# Patient Record
Sex: Female | Born: 1970 | Race: Black or African American | Hispanic: No | Marital: Single | Smoking: Never smoker
Health system: Southern US, Community
[De-identification: ages and names within clinical notes are randomized; demographics above are authoritative.]

## PROBLEM LIST (undated history)

## (undated) DIAGNOSIS — T4145XA Adverse effect of unspecified anesthetic, initial encounter: Secondary | ICD-10-CM

## (undated) DIAGNOSIS — M1990 Unspecified osteoarthritis, unspecified site: Secondary | ICD-10-CM

## (undated) DIAGNOSIS — T8859XA Other complications of anesthesia, initial encounter: Secondary | ICD-10-CM

## (undated) DIAGNOSIS — I10 Essential (primary) hypertension: Secondary | ICD-10-CM

## (undated) DIAGNOSIS — Z9889 Other specified postprocedural states: Secondary | ICD-10-CM

## (undated) DIAGNOSIS — R112 Nausea with vomiting, unspecified: Secondary | ICD-10-CM

## (undated) DIAGNOSIS — F32A Depression, unspecified: Secondary | ICD-10-CM

## (undated) DIAGNOSIS — F429 Obsessive-compulsive disorder, unspecified: Secondary | ICD-10-CM

## (undated) DIAGNOSIS — F329 Major depressive disorder, single episode, unspecified: Secondary | ICD-10-CM

## (undated) DIAGNOSIS — A159 Respiratory tuberculosis unspecified: Secondary | ICD-10-CM

## (undated) HISTORY — DX: Essential (primary) hypertension: I10

## (undated) HISTORY — DX: Obsessive-compulsive disorder, unspecified: F42.9

## (undated) HISTORY — PX: TOOTH EXTRACTION: SUR596

## (undated) HISTORY — DX: Depression, unspecified: F32.A

## (undated) HISTORY — DX: Major depressive disorder, single episode, unspecified: F32.9

## (undated) HISTORY — PX: COLONOSCOPY: SHX174

## (undated) HISTORY — PX: NO PAST SURGERIES: SHX2092

---

## 2009-01-28 ENCOUNTER — Encounter: Payer: Self-pay | Admitting: Emergency Medicine

## 2009-01-29 ENCOUNTER — Ambulatory Visit: Payer: Self-pay | Admitting: Psychiatry

## 2009-01-29 ENCOUNTER — Inpatient Hospital Stay (HOSPITAL_COMMUNITY): Admission: RE | Admit: 2009-01-29 | Discharge: 2009-02-03 | Payer: Self-pay | Admitting: Psychiatry

## 2010-08-11 LAB — URINALYSIS, ROUTINE W REFLEX MICROSCOPIC
Hgb urine dipstick: NEGATIVE
Nitrite: NEGATIVE
Protein, ur: NEGATIVE mg/dL
Urobilinogen, UA: 1 mg/dL (ref 0.0–1.0)

## 2010-08-11 LAB — CBC
Hemoglobin: 13.5 g/dL (ref 12.0–15.0)
RBC: 4.89 MIL/uL (ref 3.87–5.11)

## 2010-08-11 LAB — RAPID URINE DRUG SCREEN, HOSP PERFORMED
Amphetamines: NOT DETECTED
Barbiturates: NOT DETECTED
Benzodiazepines: NOT DETECTED
Tetrahydrocannabinol: NOT DETECTED

## 2010-08-11 LAB — COMPREHENSIVE METABOLIC PANEL
ALT: 20 U/L (ref 0–35)
Alkaline Phosphatase: 54 U/L (ref 39–117)
CO2: 26 mEq/L (ref 19–32)
Chloride: 102 mEq/L (ref 96–112)
GFR calc non Af Amer: >60 mL/min (ref >60)
Glucose, Bld: 99 mg/dL (ref 70–99)
Potassium: 3.9 mEq/L (ref 3.5–5.1)
Sodium: 135 mEq/L (ref 135–145)
Total Bilirubin: 0.6 mg/dL (ref 0.3–1.2)

## 2010-08-11 LAB — GC/CHLAMYDIA PROBE AMP, URINE: Chlamydia, Swab/Urine, PCR: NEGATIVE

## 2010-08-11 LAB — HIV ANTIBODY (ROUTINE TESTING W REFLEX): HIV: NONREACTIVE

## 2010-08-11 LAB — DIFFERENTIAL
Basophils Relative: 1 % (ref 0–1)
Eosinophils Absolute: 0 10*3/uL (ref 0.0–0.7)
Monocytes Relative: 6 % (ref 3–12)
Neutrophils Relative %: 62 % (ref 43–77)

## 2010-08-11 LAB — URINE MICROSCOPIC-ADD ON

## 2010-08-11 LAB — RPR: RPR Ser Ql: NONREACTIVE

## 2011-05-14 ENCOUNTER — Ambulatory Visit
Admission: RE | Admit: 2011-05-14 | Discharge: 2011-05-14 | Disposition: A | Discharge status: Home / Self Care | Payer: Medicaid Other | Source: Ambulatory Visit | Attending: Specialist | Admitting: Specialist

## 2011-05-14 ENCOUNTER — Other Ambulatory Visit: Payer: Self-pay | Admitting: Specialist

## 2011-05-14 DIAGNOSIS — M549 Dorsalgia, unspecified: Secondary | ICD-10-CM

## 2013-02-12 ENCOUNTER — Encounter (HOSPITAL_COMMUNITY): Payer: Self-pay | Admitting: Emergency Medicine

## 2013-02-12 ENCOUNTER — Emergency Department (HOSPITAL_COMMUNITY)
Admission: EM | Admit: 2013-02-12 | Discharge: 2013-02-12 | Disposition: A | Discharge status: Home / Self Care | Payer: PRIVATE HEALTH INSURANCE | Source: Home / Self Care

## 2013-02-12 DIAGNOSIS — R141 Gas pain: Secondary | ICD-10-CM

## 2013-02-12 DIAGNOSIS — R350 Frequency of micturition: Secondary | ICD-10-CM

## 2013-02-12 DIAGNOSIS — K5900 Constipation, unspecified: Secondary | ICD-10-CM

## 2013-02-12 LAB — POCT PREGNANCY, URINE: Preg Test, Ur: NEGATIVE

## 2013-02-12 LAB — POCT URINALYSIS DIP (DEVICE)
Bilirubin Urine: NEGATIVE
Glucose, UA: NEGATIVE mg/dL
Hgb urine dipstick: NEGATIVE
Ketones, ur: NEGATIVE mg/dL
Leukocytes, UA: NEGATIVE
Nitrite: NEGATIVE
Protein, ur: NEGATIVE mg/dL
pH: 7 (ref 5.0–8.0)

## 2013-02-12 NOTE — ED Provider Notes (Signed)
CSN: [id number]     Arrival date & time 02/12/13  4098 History   First MD Initiated Contact with Patient 02/12/13 1010     Chief Complaint  Patient presents with  . Urinary Tract Infection   (Consider location/radiation/quality/duration/timing/severity/associated sxs/prior Treatment) HPI Comments: 42 year old female complaining of urinary frequency and vaginal odor to the urine. This started 4-5 days ago. She denies dysuria. Her other concern is that her LMP was 9:15 it was a light flow and she did not have the usual uterine-type cramps that she usually does. Denies pelvic pain, vaginal discharge or bleeding.  Patient is a 42 y.o. female presenting with urinary tract infection.  Urinary Tract Infection Pertinent negatives include no abdominal pain.    History reviewed. No pertinent past medical history. History reviewed. No pertinent past surgical history. No family history on file. History  Substance Use Topics  . Smoking status: Never Smoker   . Smokeless tobacco: Not on file  . Alcohol Use: No   OB History   Grav Para Term Preterm Abortions TAB SAB Ect Mult Living                 Review of Systems  Constitutional: Negative.  Negative for fever.  HENT: Negative.   Respiratory: Negative.   Cardiovascular: Positive for palpitations.  Gastrointestinal: Positive for constipation. Negative for abdominal pain, diarrhea and blood in stool.       Bloating. The sensation of "pokes" and movement in the abdomen.  Genitourinary: Positive for frequency. Negative for dysuria, flank pain, vaginal bleeding, vaginal discharge, vaginal pain and pelvic pain.  Skin: Negative for rash.  Neurological: Negative.     Allergies  Review of patient's allergies indicates no known allergies.  Home Medications   Current Outpatient Rx  Name  Route  Sig  Dispense  Refill  . meloxicam (MOBIC) 7.5 MG tablet   Oral   Take 7.5 mg by mouth daily.          BP 147/75  Pulse 54  Temp(Src) 98 F  (36.7 C)  SpO2 100%  LMP 01/19/2013 Physical Exam  Nursing note and vitals reviewed. Constitutional: She is oriented to person, place, and time. She appears well-developed and well-nourished. No distress.  Eyes: Conjunctivae and EOM are normal.  Neck: Normal range of motion.  Cardiovascular: Normal rate.   Pulmonary/Chest: Effort normal and breath sounds normal.  Abdominal: Soft. Bowel sounds are normal. She exhibits no distension. There is no tenderness. There is no rebound and no guarding.  Abdomen pelvis is tympanic in several areas. No tenderness.  Musculoskeletal: She exhibits no edema.  Neurological: She is alert and oriented to person, place, and time. She exhibits normal muscle tone.  Skin: Skin is warm and dry. No rash noted. No erythema.    ED Course  Procedures (including critical care time) Labs Review Labs Reviewed  URINE CULTURE  POCT URINALYSIS DIP (DEVICE)   Imaging Review No results found. Results for orders placed during the hospital encounter of 02/12/13  POCT URINALYSIS DIP (DEVICE)      Result Value Range   Glucose, UA NEGATIVE  NEGATIVE mg/dL   Bilirubin Urine NEGATIVE  NEGATIVE   Ketones, ur NEGATIVE  NEGATIVE mg/dL   Specific Gravity, Urine 1.025  1.005 - 1.030   Hgb urine dipstick NEGATIVE  NEGATIVE   pH 7.0  5.0 - 8.0   Protein, ur NEGATIVE  NEGATIVE mg/dL   Urobilinogen, UA 0.2  0.0 - 1.0 mg/dL   Nitrite NEGATIVE  NEGATIVE   Leukocytes, UA NEGATIVE  NEGATIVE   Pregnancy test reported by [facility] as negative.   MDM   1. Constipation   2. Abdominal gas pain   3. Urinary frequency       For dysuria, pain or burning with urination, urinary urgency or increased frequency or pelvic discomfort recheck promptly. We will culture the urine and for any growth will treat over the phone. Recommend increasing the fiber in her diet as well as fluids. Constipation take MiraLax as needed.  Hayden Rasmussen, NP 02/12/13 1054

## 2013-02-12 NOTE — ED Notes (Signed)
Reports low abdominal soreness or tenderness, reports frequent urination and odor to urine and urine is cloudy.  Denies pain with urination, denies vaginal discharge.  Last period 9/15-different-spotted.  Patient reports a negative home pregnancy test

## 2013-02-13 LAB — URINE CULTURE: Colony Count: 9000

## 2013-02-13 NOTE — ED Provider Notes (Signed)
Medical screening examination/treatment/procedure(s) were performed by a resident physician or non-physician practitioner and as the supervising physician I was immediately available for consultation/collaboration.  Alohilani Levenhagen, MD    Victoriana Aziz S Davielle Lingelbach, MD 02/13/13 0736 

## 2013-10-13 ENCOUNTER — Ambulatory Visit (INDEPENDENT_AMBULATORY_CARE_PROVIDER_SITE_OTHER): Payer: No Typology Code available for payment source | Admitting: Family Medicine

## 2013-10-13 ENCOUNTER — Encounter: Payer: Self-pay | Admitting: Family Medicine

## 2013-10-13 VITALS — BP 143/75 | HR 68 | Temp 98.2°F | Resp 20 | Ht 68.0 in | Wt 257.0 lb

## 2013-10-13 DIAGNOSIS — L659 Nonscarring hair loss, unspecified: Secondary | ICD-10-CM

## 2013-10-13 DIAGNOSIS — R635 Abnormal weight gain: Secondary | ICD-10-CM

## 2013-10-13 DIAGNOSIS — Z1231 Encounter for screening mammogram for malignant neoplasm of breast: Secondary | ICD-10-CM

## 2013-10-13 DIAGNOSIS — F411 Generalized anxiety disorder: Secondary | ICD-10-CM

## 2013-10-13 DIAGNOSIS — M79609 Pain in unspecified limb: Secondary | ICD-10-CM

## 2013-10-13 DIAGNOSIS — Z23 Encounter for immunization: Secondary | ICD-10-CM

## 2013-10-13 DIAGNOSIS — F329 Major depressive disorder, single episode, unspecified: Secondary | ICD-10-CM | POA: Insufficient documentation

## 2013-10-13 DIAGNOSIS — R252 Cramp and spasm: Secondary | ICD-10-CM

## 2013-10-13 DIAGNOSIS — M79651 Pain in right thigh: Secondary | ICD-10-CM

## 2013-10-13 DIAGNOSIS — F3289 Other specified depressive episodes: Secondary | ICD-10-CM

## 2013-10-13 DIAGNOSIS — F32A Depression, unspecified: Secondary | ICD-10-CM | POA: Insufficient documentation

## 2013-10-13 MED ORDER — MELOXICAM 15 MG PO TABS: 15.0000 mg | ORAL_TABLET | Freq: Every day | ORAL | Status: DC

## 2013-10-13 NOTE — Patient Instructions (Signed)
Follow 1800 calorie diet divided over 6 small meals (written information provided) Increase water intake to 6-8 glasses  Start walking regimen 3 days per week for 30 minutes Start Meloxicam 15 mg daily for right thigh pain.

## 2013-10-13 NOTE — Progress Notes (Signed)
Subjective:    Patient ID: Megan [name], female    DOB: June 18, 1970, 43 y.o.   MRN: [id number]  HPI Patient reports that she is in office for transition into care.   Patient complaining of right thigh pain for greater than 2 months. Reports that she was taking Meloxicam 7.5 mg daily and Vicodin. Current pain intensity is 6/10, intermittent, and radiates to lower back. She states that she was diagnosed with degenerative disc disease and arthritis some years ago.   Patient reports weight gain. States that weight has been increasing for the past year. She maintains that she eats 1-2 times daily on most days and she does not have an appetite. Denies headache, dizziness, nausea, vomiting, diarrhea, constipation, or fatigue. She reports hair thinning.      Review of Systems  Constitutional: Positive for appetite change. Negative for fatigue.  HENT: Negative.   Eyes: Negative.   Respiratory: Negative.   Cardiovascular: Positive for palpitations.  Gastrointestinal: Positive for constipation.  Endocrine: Negative.   Genitourinary: Negative.   Allergic/Immunologic: Negative.   Neurological: Negative.   Hematological: Negative.   Psychiatric/Behavioral: Negative.        Objective:   Physical Exam  Constitutional: She is oriented to person, place, and time. She appears well-developed and well-nourished. She does not have a sickly appearance.  HENT:  Head: Normocephalic and atraumatic.  Right Ear: Hearing, tympanic membrane, external ear and ear canal normal.  Left Ear: Hearing, tympanic membrane, external ear and ear canal normal.  Nose: Nose normal.  Mouth/Throat: Uvula is midline.  Eyes: Conjunctivae and EOM are normal. Pupils are equal, round, and reactive to light. Lids are everted and swept, no foreign bodies found.  Neck: Trachea normal and normal range of motion. Neck supple. No mass and no thyromegaly present.  Cardiovascular: Normal rate, regular rhythm and normal heart  sounds.   Pulmonary/Chest: Effort normal and breath sounds normal.  Abdominal: Soft. Normal appearance and bowel sounds are normal. There is no tenderness.  Musculoskeletal:       Right knee: She exhibits decreased range of motion. She exhibits no swelling.       Legs: Lymphadenopathy:       Head (right side): No submental and no submandibular adenopathy present.       Head (left side): No submental and no submandibular adenopathy present.    She has no cervical adenopathy.  Neurological: She is alert and oriented to person, place, and time. She has normal strength. No cranial nerve deficit or sensory deficit.  Skin: Skin is warm, dry and intact. No rash noted. No cyanosis. Nails show no clubbing.     Psychiatric: She has a normal mood and affect. Her speech is normal and behavior is normal. Judgment and thought content normal.    BP 143/75  Pulse 68  Temp(Src) 98.2 F (36.8 C) (Oral)  Resp 20  Ht 5\' 8"  (1.727 m)  Wt 257 lb (116.574 kg)  BMI 39.09 kg/m2      Assessment & Plan:  1. Right thigh pain: Reports right thigh pain 6/10 in intensity, intermittent, and throbbing. She states that pain radiates from low back to right thigh at times. Right thigh pain interferes with activities of daily living. She states that she works at [employer] and stands for long periods of time. Recommend that she adds insert to her work tennis shoes. Elevate lower extremities at rest. Apply warm compress to right thigh for 20 minutes 4 times daily as needed. Recommend that patient  loose weight. Started Meloxicam 15 mg daily.    2. Weight gain: Patient has reports that weight has been fluctuating. Recommend that patient start 1800 calorie lowfat, low-carbohydrate diet divided over 6 small meals. Increase water intake to 6-8 glasses per day. Start walking regimen (low impact), 15 minutes 3 times per week. Increase walking time weekly. Check HbA1C and TSH.   3. Depression- Patient reports that her son goes to  [facility], she feels comfortable with staff and plans on making an appointment . She reports that she was on anti-depressants years ago, but discontinued use due to insurance constraints. Patient scored 9 on depression screen. She denies suicidal or homicidal ideations. She states that she has had  obsessive compulsions in the past. She reports that she repeatedly counts objects and has repetitive motions during periods of anxiety. .   4. 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 Has to get chest x-rays. Will review previous medical records as they become available RTC: 2 months with Dr. Ashley RoyaltyMatthews for CPE with Pap smear Mammogram: Referral for mammogram. Patient has never had a mammogram.  Immunizations: TDap vaccination given without complication. VIS reviewed prior to administering vaccination.  Gynecological: Pap smear, 3 years ago, normal per patient. States that menstrual periods are normal. Patient has 3 sons.   Massie MaroonLachina M Hollis, FNP

## 2013-10-14 ENCOUNTER — Telehealth: Payer: Self-pay

## 2013-10-14 NOTE — Telephone Encounter (Signed)
Pt was contacted and VM was left  informing her of an Appointment to the [facility] [phone] Appointment date is 10/22/2013@ 2:20pm Pt was advised to contact office if she needed more info.

## 2013-10-15 LAB — BASIC METABOLIC PANEL
BUN: 13 mg/dL (ref 6–23)
CHLORIDE: 104 meq/L (ref 96–112)
CO2: 23 meq/L (ref 19–32)
CREATININE: 0.96 mg/dL (ref 0.50–1.10)
Calcium: 9.3 mg/dL (ref 8.4–10.5)
GLUCOSE: 82 mg/dL (ref 70–99)
Potassium: 4.2 mEq/L (ref 3.5–5.3)
Sodium: 139 mEq/L (ref 135–145)

## 2013-10-15 LAB — TSH: TSH: 1.454 u[IU]/mL (ref 0.350–4.500)

## 2013-10-15 LAB — HEMOGLOBIN A1C
Hgb A1c MFr Bld: 5.6 % (ref <5.7)
Mean Plasma Glucose: 114 mg/dL (ref <117)

## 2013-10-16 DIAGNOSIS — L659 Nonscarring hair loss, unspecified: Secondary | ICD-10-CM | POA: Insufficient documentation

## 2013-10-22 ENCOUNTER — Ambulatory Visit: Payer: No Typology Code available for payment source

## 2013-10-28 ENCOUNTER — Ambulatory Visit
Admission: RE | Admit: 2013-10-28 | Discharge: 2013-10-28 | Disposition: A | Discharge status: Home / Self Care | Payer: No Typology Code available for payment source | Source: Ambulatory Visit | Attending: Family Medicine | Admitting: Family Medicine

## 2013-10-28 DIAGNOSIS — Z1231 Encounter for screening mammogram for malignant neoplasm of breast: Secondary | ICD-10-CM

## 2013-12-17 ENCOUNTER — Ambulatory Visit (INDEPENDENT_AMBULATORY_CARE_PROVIDER_SITE_OTHER): Payer: No Typology Code available for payment source | Admitting: Internal Medicine

## 2013-12-17 VITALS — BP 147/90 | HR 56 | Temp 98.2°F | Resp 14 | Ht 68.0 in | Wt 257.0 lb

## 2013-12-17 DIAGNOSIS — M5441 Lumbago with sciatica, right side: Secondary | ICD-10-CM

## 2013-12-17 DIAGNOSIS — Z0000 Encounter for general adult medical examination without abnormal findings: Secondary | ICD-10-CM

## 2013-12-17 DIAGNOSIS — N898 Other specified noninflammatory disorders of vagina: Secondary | ICD-10-CM

## 2013-12-17 DIAGNOSIS — M25559 Pain in unspecified hip: Secondary | ICD-10-CM

## 2013-12-17 DIAGNOSIS — M25551 Pain in right hip: Secondary | ICD-10-CM

## 2013-12-17 DIAGNOSIS — M5430 Sciatica, unspecified side: Secondary | ICD-10-CM

## 2013-12-17 NOTE — Progress Notes (Signed)
Patient ID: Megan [name], female   DOB: 1970-08-20, 43 y.o.   MRN: [id number]   Megan [name], is a 43 y.o. female  WUJ:[id number]  [id number]  DOB - 1970-08-20  CC:  Chief Complaint  Patient presents with  . Annual Exam       HPI: Megan [name] is a 43 y.o. female here today for Annual Physical Examination.  She is sexually active with one partner. She has had a vaginal discharge without any odor. She reports that she has mid-ovulatory pain on a regular basis. He last mammogram was performed about 1 month ago and was normal. She has gained weight since her last Physical 2 years ago and is concerned that she is unable to maintain a healthy weight especially with the pain that she has been having in her back and right hip.    She has pain in the right low back area with radiation of pain down the right thigh and "sweeping around tot he front". Pt also c/o pain in right hip and states that  she feels as if her bones are grinding together when she walks. This pain limits her walking and is impacting her ability to perform her job.  No headache, No chest pain, No abdominal pain - No Nausea, No new weakness tingling or numbness, No Cough - SOB.  No Known Allergies No past medical history on file. Current Outpatient Prescriptions on File Prior to Visit  Medication Sig Dispense Refill  . Naproxen Sodium (ALEVE PO) Take by mouth.       No current facility-administered medications on file prior to visit.   No family history on file. History   Social History  . Marital Status: Single    Spouse Name: N/A    Number of Children: N/A  . Years of Education: N/A   Occupational History  . Not on file.   Social History Main Topics  . Smoking status: Never Smoker   . Smokeless tobacco: Not on file  . Alcohol Use: No  . Drug Use: No  . Sexual Activity: Not on file   Other Topics Concern  . Not on file   Social History Narrative  . No narrative on file    Review of  Systems: Constitutional: Negative for fever, chills, diaphoresis, activity change, appetite change and fatigue. HENT: Negative for ear pain, nosebleeds, congestion, facial swelling, rhinorrhea, neck pain, neck stiffness and ear discharge.  Eyes: Negative for pain, discharge, redness, itching and visual disturbance. Respiratory: Negative for cough, choking, chest tightness, shortness of breath, wheezing and stridor.  Cardiovascular: Negative for chest pain, palpitations and leg swelling. Gastrointestinal: Negative for abdominal distention. Genitourinary: Negative for dysuria, urgency, frequency, hematuria, flank pain, decreased urine volume, difficulty urinating and dyspareunia.  Musculoskeletal: Negative for back pain, joint swelling, arthralgia and gait problem. Neurological: Negative for dizziness, tremors, seizures, syncope, facial asymmetry, speech difficulty, weakness, light-headedness, numbness and headaches.  Hematological: Negative for adenopathy. Does not bruise/bleed easily. Psychiatric/Behavioral: Negative for hallucinations, behavioral problems, confusion, dysphoric mood, decreased concentration and agitation.    Objective:         Filed Vitals:   12/17/13 0902  BP: 147/90  Pulse: 56  Temp: 98.2 F (36.8 C)  Resp: 14    Physical Exam: Constitutional: Patient appears well-developed and well-nourished. No distress. HENT: Normocephalic, atraumatic, External right and left ear normal. Oropharynx is clear and moist.  Eyes: Conjunctivae and EOM are normal. PERRLA, no scleral icterus. Neck: Normal ROM. Neck supple. No JVD. No tracheal deviation.  No thyromegaly. CVS: RRR, S1/S2 +, no murmurs, no gallops, no carotid bruit.  Pulmonary: Effort and breath sounds normal, no stridor, rhonchi, wheezes, rales.  Abdominal: Soft. BS +, no distension, tenderness, rebound or guarding.  Musculoskeletal: Normal range of motion. Tenderness elicited on palpation of the right Sacro-iliac  joint. Pt has pain with passive movement of the right hip and AROM is limited by pain. She does have a small leg length discrepancy Lymphadenopathy: No lymphadenopathy noted, cervical, inguinal or axillary Neuro: Alert. Normal reflexes, muscle tone coordination. No cranial nerve deficit. Skin: Skin is warm and dry. No rash noted. Not diaphoretic. No erythema. No pallor. Psychiatric: Normal mood and affect. Behavior, judgment, thought content normal. Genitalia: External genitalia normal. Vaginal vault normal with normal physiological discharge. No friability and no bleeding. Bi-manual examination reveals no tenderness in supra-pubic region and adnexal examination normal.  Lab Results  Component Value Date   WBC 3.7* 01/28/2009   HGB 13.5 01/28/2009   HCT 40.7 01/28/2009   MCV 83.2 01/28/2009   PLT 237 01/28/2009   Lab Results  Component Value Date   CREATININE 0.96 10/13/2013   BUN 13 10/13/2013   NA 139 10/13/2013   K 4.2 10/13/2013   CL 104 10/13/2013   CO2 23 10/13/2013    Lab Results  Component Value Date   HGBA1C 5.6 10/13/2013   Lipid Panel  No results found for this basename: chol, trig, hdl, cholhdl, vldl, ldlcalc       Assessment and plan:   1. Annual physical exam - Lipid panel - COMPLETE METABOLIC PANEL WITH GFR - PAP, Thin Prep w/HPV rflx HPV Type 16/18 ([facility]) - HIV antibody (with reflex)  2. Right Sided low back pain with right-sided sciatica - Advised patient to perform stretching exercises with ROM limited by pain.  3. Right hip pain - Suspect osteoarthritis. Will obtain X-ray of hip. Discontinue meloxacam and continue aleve for now.  - DG Hip Complete Right; Future  4. Vaginal discharge - Likely Physiological - GC/Chlamydia Amp Probe, Genital - HIV antibody (with reflex)   Return in about 1 month (around 01/17/2014) for Blood Pressure.  The patient was given clear instructions to go to ER or return to medical center if symptoms don't improve, worsen or new  problems develop. The patient verbalized understanding. The patient was told to call to get lab results if they haven't heard anything in the next week.     This note has been created with Education officer, environmental. Any transcriptional errors are unintentional.    MATTHEWS,MICHELLE A., MD [facility] [place], [place] [phone]   12/17/2013, 10:29 AM

## 2013-12-18 LAB — COMPLETE METABOLIC PANEL WITH GFR
ALT: 11 U/L (ref 0–35)
AST: 16 U/L (ref 0–37)
Albumin: 4.3 g/dL (ref 3.5–5.2)
Alkaline Phosphatase: 49 U/L (ref 39–117)
BUN: 9 mg/dL (ref 6–23)
CALCIUM: 8.8 mg/dL (ref 8.4–10.5)
CHLORIDE: 103 meq/L (ref 96–112)
CO2: 27 mEq/L (ref 19–32)
Creat: 0.78 mg/dL (ref 0.50–1.10)
GFR, Est African American: >89 mL/min
GFR, Est Non African American: >89 mL/min
Glucose, Bld: 100 mg/dL — ABNORMAL HIGH (ref 70–99)
Potassium: 4.1 mEq/L (ref 3.5–5.3)
SODIUM: 138 meq/L (ref 135–145)
TOTAL PROTEIN: 7.2 g/dL (ref 6.0–8.3)
Total Bilirubin: 0.4 mg/dL (ref 0.2–1.2)

## 2013-12-18 LAB — LIPID PANEL
Cholesterol: 181 mg/dL (ref 0–200)
HDL: 43 mg/dL (ref >39)
LDL CALC: 126 mg/dL — AB (ref 0–99)
Total CHOL/HDL Ratio: 4.2 Ratio
Triglycerides: 60 mg/dL (ref <150)
VLDL: 12 mg/dL (ref 0–40)

## 2013-12-18 LAB — PAP, THIN PREP W/HPV RFLX HPV TYPE 16/18: HPV DNA High Risk: NOT DETECTED

## 2013-12-18 LAB — HIV ANTIBODY (ROUTINE TESTING W REFLEX): HIV: NONREACTIVE

## 2013-12-20 ENCOUNTER — Other Ambulatory Visit: Payer: Self-pay | Admitting: Internal Medicine

## 2013-12-25 LAB — GC/CHLAMYDIA PROBE AMP, GENITAL
CHLAMYDIA, DNA PROBE: NOT DETECTED
GC Probe Amp, Genital: NOT DETECTED

## 2014-01-07 ENCOUNTER — Telehealth: Payer: Self-pay | Admitting: Internal Medicine

## 2014-01-07 NOTE — Telephone Encounter (Signed)
Left voicemail for patient to call to reschedule appointment for 01/20/14

## 2014-01-20 ENCOUNTER — Ambulatory Visit: Payer: No Typology Code available for payment source | Admitting: Internal Medicine

## 2014-01-21 ENCOUNTER — Ambulatory Visit: Payer: No Typology Code available for payment source | Admitting: Internal Medicine

## 2014-01-25 ENCOUNTER — Ambulatory Visit (INDEPENDENT_AMBULATORY_CARE_PROVIDER_SITE_OTHER): Payer: No Typology Code available for payment source | Admitting: Internal Medicine

## 2014-01-25 VITALS — BP 145/79 | HR 69 | Temp 98.3°F | Resp 16 | Ht 68.0 in | Wt 256.0 lb

## 2014-01-25 DIAGNOSIS — M5430 Sciatica, unspecified side: Secondary | ICD-10-CM | POA: Insufficient documentation

## 2014-01-25 DIAGNOSIS — I10 Essential (primary) hypertension: Secondary | ICD-10-CM

## 2014-01-25 DIAGNOSIS — M5431 Sciatica, right side: Secondary | ICD-10-CM

## 2014-01-25 MED ORDER — HYDROCHLOROTHIAZIDE 12.5 MG PO TABS: 12.5000 mg | ORAL_TABLET | Freq: Every day | ORAL | Status: DC

## 2014-01-25 MED ORDER — POTASSIUM CHLORIDE CRYS ER 20 MEQ PO TBCR: 20.0000 meq | EXTENDED_RELEASE_TABLET | Freq: Two times a day (BID) | ORAL | Status: DC

## 2014-01-25 NOTE — Progress Notes (Signed)
Patient ID: Megan [name], female   DOB: [birth year], 43 y.o.   MRN: [id number]   Megan [name], is a 43 y.o. female  WUJ:[id number]  NFA:[id number]  DOB - [birth year]  CC:  Chief Complaint  Patient presents with  . Follow-up       HPI: Megan [name] is a 43 y.o. female here today to follow up on BP. SHe continues to have an elevated BP which places her in stage 1 HTN.   Pt also reports that she has continued pain in the back and right LE. She had initially complained of pain in her right hip. However She now reports that the pain is in her low back and radiating to right hip and anterior thigh. It varies in intensity depending on activity and is sharp and aching in nature.  It is present with standing and even sitting for long periods. I have reviewed her lumbar x-rays from 2013 which showed minimal loss of disc space at L5-S1.  She reports that she has noticed a better fit in her clothes but this is not reflected in the scales which is somewhat disappointing.   Patient has No headache, No chest pain, No abdominal pain - No Nausea, No new weakness tingling or numbness, No Cough - SOB.  No Known Allergies No past medical history on file. Current Outpatient Prescriptions on File Prior to Visit  Medication Sig Dispense Refill  . Naproxen Sodium (ALEVE PO) Take by mouth.       No current facility-administered medications on file prior to visit.   No family history on file. History   Social History  . Marital Status: Single    Spouse Name: N/A    Number of Children: N/A  . Years of Education: N/A   Occupational History  . Not on file.   Social History Main Topics  . Smoking status: Never Smoker   . Smokeless tobacco: Not on file  . Alcohol Use: No  . Drug Use: No  . Sexual Activity: Not on file   Other Topics Concern  . Not on file   Social History Narrative  . No narrative on file    Review of Systems: Constitutional: Negative for fever, chills, diaphoresis,  activity change, appetite change and fatigue. HENT: Negative for ear pain, nosebleeds, congestion, facial swelling, rhinorrhea, neck pain, neck stiffness and ear discharge.  Eyes: Negative for pain, discharge, redness, itching and visual disturbance. Respiratory: Negative for cough, choking, chest tightness, shortness of breath, wheezing and stridor.  Cardiovascular: Negative for chest pain, palpitations and leg swelling. Gastrointestinal: Negative for abdominal distention. Genitourinary: Negative for dysuria, urgency, frequency, hematuria, flank pain, decreased urine volume, difficulty urinating and dyspareunia.  Musculoskeletal: Negative for back pain, joint swelling, arthralgia and gait problem. Neurological: Negative for dizziness, tremors, seizures, syncope, facial asymmetry, speech difficulty, weakness, light-headedness, numbness and headaches.  Hematological: Negative for adenopathy. Does not bruise/bleed easily. Psychiatric/Behavioral: Negative for hallucinations, behavioral problems, confusion, dysphoric mood, decreased concentration and agitation.    Objective:    Filed Vitals:   01/25/14 1552  BP: 145/79  Pulse: 69  Temp: 98.3 F (36.8 C)  Resp: 16    Physical Exam: Constitutional: Patient appears well-developed and well-nourished. No distress. HENT: Normocephalic, atraumatic, External right and left ear normal. Oropharynx is clear and moist.  Eyes: Conjunctivae and EOM are normal. PERRLA, no scleral icterus. Neck: Normal ROM. Neck supple. No JVD. No tracheal deviation. No thyromegaly. CVS: RRR, S1/S2 +, no murmurs, no gallops, no carotid bruit.  Pulmonary:  Effort and breath sounds normal, no stridor, rhonchi, wheezes, rales.  Abdominal: Soft. BS +, no distension, tenderness, rebound or guarding.  Musculoskeletal: Normal range of motion. No edema and no tenderness.  Lymphadenopathy: No lymphadenopathy noted, cervical, inguinal or axillary Neuro: Alert. Normal reflexes,  muscle tone coordination. No cranial nerve deficit. Skin: Skin is warm and dry. No rash noted. Not diaphoretic. No erythema. No pallor. Psychiatric: Normal mood and affect. Behavior, judgment, thought content normal.  Lab Results  Component Value Date   WBC 3.7* 01/28/2009   HGB 13.5 01/28/2009   HCT 40.7 01/28/2009   MCV 83.2 01/28/2009   PLT 237 01/28/2009   Lab Results  Component Value Date   CREATININE 0.78 12/17/2013   BUN 9 12/17/2013   NA 138 12/17/2013   K 4.1 12/17/2013   CL 103 12/17/2013   CO2 27 12/17/2013    Lab Results  Component Value Date   HGBA1C 5.6 10/13/2013   Lipid Panel     Component Value Date/Time   CHOL 181 12/17/2013 1020   TRIG 60 12/17/2013 1020   HDL 43 12/17/2013 1020   CHOLHDL 4.2 12/17/2013 1020   VLDL 12 12/17/2013 1020   LDLCALC 126* 12/17/2013 1020       Assessment and plan:   1. Essential hypertension Pt's BP has remained elevated. I have had along discussion with heron the risks associated with HTN and will initiate pharmacotherapy. - hydrochlorothiazide (HYDRODIURIL) 12.5 MG tablet; Take 1 tablet (12.5 mg total) by mouth daily.  Dispense: 90 tablet; Refill: 3 - potassium chloride SA (K-DUR,KLOR-CON) 20 MEQ tablet; Take 1 tablet (20 mEq total) by mouth 2 (two) times daily.  Dispense: 90 tablet; Refill: 3  2. Sciatica, right - I have advised patient to continue weight los efforts. - I will speak with back specialist however I suspect that the only options at this time is steroid injection into the back as her function is not neurologically affected. I will not pursue any radiological evaluation at this time as her symptoms do not reflect nerve impingement and will likely show a progression of DJD.  3. Immunization - Pt refused Influenza Vaccine siteing personal choice.  Return in about 3 months (around 04/26/2014) for HTN, Obesity, Back pain.  The patient was given clear instructions to go to ER or return to medical center if symptoms don't  improve, worsen or new problems develop. The patient verbalized understanding. The patient was told to call to get lab results if they haven't heard anything in the next week.      MATTHEWS,MICHELLE A., MD [facility] [address] [place], [place] [phone]   01/25/2014, 4:57 PM

## 2014-02-03 ENCOUNTER — Ambulatory Visit: Payer: No Typology Code available for payment source | Admitting: Internal Medicine

## 2014-07-20 ENCOUNTER — Encounter: Payer: Self-pay | Admitting: Family Medicine

## 2014-07-20 ENCOUNTER — Ambulatory Visit: Payer: No Typology Code available for payment source | Attending: Family Medicine | Admitting: Family Medicine

## 2014-07-20 VITALS — BP 147/84 | HR 66 | Temp 97.8°F | Resp 18 | Ht 68.0 in | Wt 261.0 lb

## 2014-07-20 DIAGNOSIS — F32A Depression, unspecified: Secondary | ICD-10-CM

## 2014-07-20 DIAGNOSIS — G8929 Other chronic pain: Secondary | ICD-10-CM | POA: Insufficient documentation

## 2014-07-20 DIAGNOSIS — R413 Other amnesia: Secondary | ICD-10-CM

## 2014-07-20 DIAGNOSIS — M25551 Pain in right hip: Secondary | ICD-10-CM

## 2014-07-20 DIAGNOSIS — F329 Major depressive disorder, single episode, unspecified: Secondary | ICD-10-CM

## 2014-07-20 DIAGNOSIS — I10 Essential (primary) hypertension: Secondary | ICD-10-CM

## 2014-07-20 LAB — CBC
HEMATOCRIT: 37.9 % (ref 36.0–46.0)
Hemoglobin: 12.3 g/dL (ref 12.0–15.0)
MCH: 26.8 pg (ref 26.0–34.0)
MCHC: 32.5 g/dL (ref 30.0–36.0)
MCV: 82.6 fL (ref 78.0–100.0)
MPV: 10.5 fL (ref 8.6–12.4)
Platelets: 235 10*3/uL (ref 150–400)
RBC: 4.59 MIL/uL (ref 3.87–5.11)
RDW: 13.2 % (ref 11.5–15.5)
WBC: 3.9 10*3/uL — ABNORMAL LOW (ref 4.0–10.5)

## 2014-07-20 LAB — LIPID PANEL
Cholesterol: 160 mg/dL (ref 0–200)
HDL: 46 mg/dL (ref >=46)
LDL Cholesterol: 102 mg/dL — ABNORMAL HIGH (ref 0–99)
Total CHOL/HDL Ratio: 3.5 Ratio
Triglycerides: 59 mg/dL (ref <150)
VLDL: 12 mg/dL (ref 0–40)

## 2014-07-20 LAB — COMPLETE METABOLIC PANEL WITH GFR
ALBUMIN: 3.8 g/dL (ref 3.5–5.2)
ALT: 13 U/L (ref 0–35)
AST: 16 U/L (ref 0–37)
Alkaline Phosphatase: 45 U/L (ref 39–117)
BUN: 11 mg/dL (ref 6–23)
CHLORIDE: 105 meq/L (ref 96–112)
CO2: 24 meq/L (ref 19–32)
Calcium: 8.9 mg/dL (ref 8.4–10.5)
Creat: 0.83 mg/dL (ref 0.50–1.10)
GFR, Est Non African American: 87 mL/min
Glucose, Bld: 79 mg/dL (ref 70–99)
Potassium: 4.6 mEq/L (ref 3.5–5.3)
SODIUM: 137 meq/L (ref 135–145)
TOTAL PROTEIN: 6.9 g/dL (ref 6.0–8.3)
Total Bilirubin: 0.6 mg/dL (ref 0.2–1.2)

## 2014-07-20 LAB — TSH: TSH: 2.12 u[IU]/mL (ref 0.350–4.500)

## 2014-07-20 LAB — RPR

## 2014-07-20 LAB — POCT GLYCOSYLATED HEMOGLOBIN (HGB A1C): Hemoglobin A1C: 5.3

## 2014-07-20 LAB — GLUCOSE, POCT (MANUAL RESULT ENTRY): POC GLUCOSE: 84 mg/dL (ref 70–99)

## 2014-07-20 MED ORDER — DULOXETINE HCL 30 MG PO CPEP: 30.0000 mg | ORAL_CAPSULE | Freq: Every day | ORAL | Status: DC

## 2014-07-20 MED ORDER — HYDROCHLOROTHIAZIDE 12.5 MG PO TABS: 12.5000 mg | ORAL_TABLET | Freq: Every day | ORAL | Status: DC

## 2014-07-20 MED ORDER — CYCLOBENZAPRINE HCL 10 MG PO TABS: 10.0000 mg | ORAL_TABLET | Freq: Three times a day (TID) | ORAL | Status: DC | PRN

## 2014-07-20 NOTE — Assessment & Plan Note (Signed)
HTN:  goal BP < 140/90 HCTZ 12.5 mg daily

## 2014-07-20 NOTE — Assessment & Plan Note (Signed)
Chronic R hip pain: Cymbalta 30 mg daily Flexeril as needed  Stay active and exercise regularly.  [facility] and [facility].

## 2014-07-20 NOTE — Progress Notes (Signed)
   Subjective:    Patient ID: Megan [name], female    DOB: Jul 06, 1970, 44 y.o.   MRN: [id number] CC: chronic pain in hips and legs   HPI 44 yo F with obesity, OCD hx of sciatica:  1. Hip pain: x 10 years. Worse over past 2 years. R posterior hip (SI area), R anterior hip along quad. Achy, deep pain with cramping. Worse after long day of work. Some sensation of knee giving out. No rash, no trauma. No eye or oral lesions. Previously tried Cymbalta for a very short period. Flexeril, naproxen and Vicodin only helped temporarily. Pain is affecting her mood negatively. She feels depression and OCD symptoms coming back.   2. Memory loss: x 2 years. Trouble remember her kids names and names of long term coworkers. Denies insomnia, substance abuse. Has hx of HTN with poor compliance with HCTZ. Has hx of depression.   3. HTN: taking HCTZ irregularly. No CP, HA, blurred vision, HA, CP.   Soc Hx: chronic non smoker Med Hx: HTN Surg Hx: negative  Review of Systems  Constitutional: Negative for fever, chills and unexpected weight change.  HENT: Negative for mouth sores and sore throat.   Respiratory: Negative.   Cardiovascular: Negative.   Gastrointestinal: Negative.   Musculoskeletal: Positive for myalgias and gait problem. Negative for back pain.  Skin: Negative for rash and wound.  Allergic/Immunologic: Negative for immunocompromised state.  Neurological: Positive for weakness. Negative for dizziness, tremors, facial asymmetry and headaches.  Psychiatric/Behavioral: Positive for confusion and dysphoric mood.       Objective:   Physical Exam BP 147/84 mmHg  Pulse 66  Temp(Src) 97.8 F (36.6 C) (Oral)  Resp 18  Ht 5\' 8"  (1.727 m)  Wt 261 lb (118.389 kg)  BMI 39.69 kg/m2  SpO2 100%  LMP 06/26/2014 General appearance: alert, cooperative and no distress Eyes: conjunctivae/corneas clear. PERRL, EOM's intact.  Throat: lips, mucosa, and tongue normal; teeth and gums normal Lungs: clear to  auscultation bilaterally Heart: regular rate and rhythm, S1, S2 normal, no murmur, click, rub or gallop Extremities: R hip normal skin, full ROM, negative FABER, log roll. 2+ LE reflexes, 2+ R LE pulses.    DG lumbar spine: 2013  LUMBAR SPINE - COMPLETE 4+ VIEW  Comparison: None.  Findings: Alignment is anatomic. Vertebral body height is maintained. Minimal loss of disc space height at L5-S1, with associated facet hypertrophy.  IMPRESSION: Mild L5-S1 spondylosis.       Assessment & Plan:

## 2014-07-20 NOTE — Assessment & Plan Note (Addendum)
Memory loss:  Control BP to goal Checking on common causes of memory loss/dementia - TSH, vit B12, cholesterol, blood sugar, syphilis, HIV

## 2014-07-20 NOTE — Patient Instructions (Addendum)
Ms. Delford FieldWright,  Thank you for coming in today. It was a pleasure meeting you. I look forward to being your primary doctor.   1. HTN:  goal BP < 140/90 HCTZ 12.5 mg daily   2. Chronic R hip pain: Cymbalta 30 mg daily Flexeril as needed  Stay active and exercise regularly.  [facility], [facility] and [facility].   3. Memory loss:  Control BP to goal Checking on common causes of memory loss/dementia - TSH, vit B12, cholesterol, blood sugar, syphilis, HIV  F/u in 6 weeks for chronic hip pain and memory loss   Dr. Armen PickupFunches

## 2014-07-20 NOTE — Progress Notes (Signed)
Establish Care Complaining Rt leg,hip and back pain No hx injury

## 2014-07-21 LAB — HIV ANTIBODY (ROUTINE TESTING W REFLEX): HIV 1&2 Ab, 4th Generation: NONREACTIVE

## 2014-07-21 LAB — VITAMIN B12: Vitamin B-12: 485 pg/mL (ref 211–911)

## 2014-07-28 ENCOUNTER — Telehealth: Payer: Self-pay | Admitting: *Deleted

## 2014-07-28 NOTE — Telephone Encounter (Signed)
Left voice message with lab results Vit D will be redrawn next visit

## 2014-07-28 NOTE — Telephone Encounter (Signed)
-----   Message from Dessa PhiJosalyn Funches, MD sent at 07/21/2014  9:50 AM EDT ----- All labs normal Neg HIV and RPR Norm B12, TSH, CBC, CMP Normal A1c, no diabetes Normal cholesterol No result for vit D, may need to be redrawn at f/u visit

## 2014-10-25 ENCOUNTER — Other Ambulatory Visit: Payer: Self-pay | Admitting: Family Medicine

## 2014-12-24 ENCOUNTER — Ambulatory Visit: Payer: No Typology Code available for payment source | Attending: Family Medicine

## 2016-06-27 ENCOUNTER — Inpatient Hospital Stay (HOSPITAL_COMMUNITY)
Admission: AD | Admit: 2016-06-27 | Discharge: 2016-06-27 | Disposition: A | Discharge status: Home / Self Care | Payer: BLUE CROSS/BLUE SHIELD | Source: Ambulatory Visit | Attending: Obstetrics & Gynecology | Admitting: Obstetrics & Gynecology

## 2016-06-27 ENCOUNTER — Encounter (HOSPITAL_COMMUNITY): Payer: Self-pay | Admitting: *Deleted

## 2016-06-27 DIAGNOSIS — Z3202 Encounter for pregnancy test, result negative: Secondary | ICD-10-CM | POA: Insufficient documentation

## 2016-06-27 DIAGNOSIS — B9689 Other specified bacterial agents as the cause of diseases classified elsewhere: Secondary | ICD-10-CM | POA: Insufficient documentation

## 2016-06-27 DIAGNOSIS — N898 Other specified noninflammatory disorders of vagina: Secondary | ICD-10-CM

## 2016-06-27 DIAGNOSIS — I10 Essential (primary) hypertension: Secondary | ICD-10-CM | POA: Diagnosis not present

## 2016-06-27 DIAGNOSIS — N760 Acute vaginitis: Secondary | ICD-10-CM | POA: Insufficient documentation

## 2016-06-27 DIAGNOSIS — R109 Unspecified abdominal pain: Secondary | ICD-10-CM | POA: Diagnosis present

## 2016-06-27 LAB — URINALYSIS, ROUTINE W REFLEX MICROSCOPIC
Bilirubin Urine: NEGATIVE
Glucose, UA: NEGATIVE mg/dL
Ketones, ur: 40 mg/dL — AB
Nitrite: NEGATIVE
PROTEIN: NEGATIVE mg/dL
Specific Gravity, Urine: >1.03 — ABNORMAL HIGH (ref 1.005–1.030)
pH: 5.5 (ref 5.0–8.0)

## 2016-06-27 LAB — URINALYSIS, MICROSCOPIC (REFLEX): RBC / HPF: NONE SEEN RBC/hpf (ref 0–5)

## 2016-06-27 LAB — WET PREP, GENITAL
Sperm: NONE SEEN
Trich, Wet Prep: NONE SEEN
Yeast Wet Prep HPF POC: NONE SEEN

## 2016-06-27 LAB — POCT PREGNANCY, URINE: PREG TEST UR: NEGATIVE

## 2016-06-27 MED ORDER — METRONIDAZOLE 500 MG PO TABS: 500.0000 mg | ORAL_TABLET | Freq: Two times a day (BID) | ORAL | 0 refills | Status: DC

## 2016-06-27 NOTE — MAU Note (Signed)
Having some vag irritation- sometimes itches, some lower abd tenderness.  Some d/c with odor.  Has had these symptoms for a couple of months.  No GYN physical in over a year, did not have insurance until recently.

## 2016-06-27 NOTE — MAU Provider Note (Signed)
History     CSN: [id number]  Arrival date and time:[id number] 06/27/16 [id number]   First Provider Initiated Contact with Patient 06/27/16 1143      Chief Complaint  Patient presents with  . Abdominal Pain  . Vaginal Discharge  . vag irritation   HPI   Ms. Megan [name] is a 46 y.o. female presenting to MAU with abdominal pain X 6 weeks.  She came in today because the past 2-3 days she has noticed a vaginal discharge that started as a thick white discharge and has now changed to more of a yellowish color.  She has been with her current partner for the past 6 years. The abdominal pain comes and goes.   OB History    Gravida Para Term Preterm AB Living   9 3     6 3    SAB TAB Ectopic Multiple Live Births     6            Past Medical History:  Diagnosis Date  . Depression Dx 2010  . Hypertension Dx 2015  . OCD (obsessive compulsive disorder)     Past Surgical History:  Procedure Laterality Date  . NO PAST SURGERIES      Family History  Problem Relation Age of Onset  . Cancer Mother   . Cancer Father     Social History  Substance Use Topics  . Smoking status: Never Smoker  . Smokeless tobacco: Never Used  . Alcohol use No    Allergies: No Known Allergies  Prescriptions Prior to Admission  Medication Sig Dispense Refill Last Dose  . Naproxen Sodium (ALEVE PO) Take 2 tablets by mouth 2 (two) times daily as needed.    06/26/2016 at Unknown time   Results for orders placed or performed during the hospital encounter of 06/27/16 (from the past 48 hour(s))  Urinalysis, Routine w reflex microscopic     Status: Abnormal   Collection Time: 06/27/16  9:40 AM  Result Value Ref Range   Color, Urine YELLOW YELLOW   APPearance CLEAR CLEAR   Specific Gravity, Urine >1.030 (H) 1.005 - 1.030   pH 5.5 5.0 - 8.0   Glucose, UA NEGATIVE NEGATIVE mg/dL   Hgb urine dipstick SMALL (A) NEGATIVE   Bilirubin Urine NEGATIVE NEGATIVE   Ketones, ur 40 (A) NEGATIVE mg/dL   Protein, ur NEGATIVE  NEGATIVE mg/dL   Nitrite NEGATIVE NEGATIVE   Leukocytes, UA MODERATE (A) NEGATIVE  Urinalysis, Microscopic (reflex)     Status: Abnormal   Collection Time: 06/27/16  9:40 AM  Result Value Ref Range   RBC / HPF NONE SEEN 0 - 5 RBC/hpf   WBC, UA 6-30 0 - 5 WBC/hpf   Bacteria, UA RARE (A) NONE SEEN   Squamous Epithelial / LPF 0-5 (A) NONE SEEN  Pregnancy, urine POC     Status: None   Collection Time: 06/27/16 10:01 AM  Result Value Ref Range   Preg Test, Ur NEGATIVE NEGATIVE    Comment:        THE SENSITIVITY OF THIS METHODOLOGY IS >24 mIU/mL   Wet prep, genital     Status: Abnormal   Collection Time: 06/27/16 11:43 AM  Result Value Ref Range   Yeast Wet Prep HPF POC NONE SEEN NONE SEEN   Trich, Wet Prep NONE SEEN NONE SEEN   Clue Cells Wet Prep HPF POC PRESENT (A) NONE SEEN   WBC, Wet Prep HPF POC MANY (A) NONE SEEN    Comment: MANY BACTERIA  SEEN   Sperm NONE SEEN     Comment: Performed at [facility], [address] [place], [place] [address]    Review of Systems  Genitourinary: Positive for vaginal discharge. Negative for dysuria and vaginal bleeding.   Physical Exam   Blood pressure 150/80, pulse 73, temperature 99.4 F (37.4 C), temperature source Oral, resp. rate 17, weight 243 lb (110.2 kg), last menstrual period 06/02/2016.  Physical Exam  Constitutional: She is oriented to person, place, and time. She appears well-developed and well-nourished.  HENT:  Head: Normocephalic.  Eyes: Pupils are equal, round, and reactive to light.  GI: Soft. Normal appearance. There is tenderness in the periumbilical area and suprapubic area. There is no rigidity, no rebound and no guarding.  Genitourinary:  Genitourinary Comments: Wet prep and Gc collected without speculum.   Musculoskeletal: Normal range of motion.  Neurological: She is alert and oriented to person, place, and time.  Skin: Skin is warm.  Psychiatric: Her behavior is normal.    MAU Course  Procedures   None  MDM  Wet prep & GC collected by RN.   Assessment and Plan   A:  1. BV (bacterial vaginosis)   2. Vaginal irritation     P:  Discharge home in stable condition Rx: Flagyl no alcohol  Discussed importance of GYN care Return to MAU for OB emergencies Increase po fluid intake.   Duane Lope, NP 06/27/2016 7:54 PM

## 2016-06-27 NOTE — Discharge Instructions (Signed)

## 2016-06-27 NOTE — ED Provider Notes (Signed)
History     CSN: [id number]  Arrival date and time: 06/27/16 [id number]   First Provider Initiated Contact with Patient 06/27/16 1143      Chief Complaint  Patient presents with  . Abdominal Pain  . Vaginal Discharge  . vag irritation   Ms. Megan [name] is a 46 y.o. Black female who presents to MAU today with c/o of mid abdominal cramping and pain for the past 6 weeks. She came in today because the past 2-3 days she has noticed a vaginal discharge that started as a thick white discharge and has now changed to more of a yellowish colored over the past 24 hours. She has not had a GYN work up since 2016 due to lack of insurance. She has been with her current partner for the past 6 years. Nothing aggravates the abdominal pain or cramping. She has tried ibuprofen with minimal relief. Denies any fever, chills, back pain, headaches, constipation, urinary symptoms, nausea or vomiting. Her last bm was today and was normal. LMS was on 05/28/2016 and she reports it was normal.    The history is provided by the patient.  Abdominal Pain  The primary symptoms of the illness include abdominal pain (mid lower pelvic area ) and vaginal discharge (yellowish, fishy odor ). The current episode started 2 days ago. The onset of the illness was gradual. The problem has not changed since onset. The patient states that she believes she is currently not pregnant. The patient has not had a change in bowel habit.  Vaginal Discharge  This is a new problem. The current episode started in the past 7 days. The problem occurs constantly. The problem has been unchanged. Associated symptoms include abdominal pain (mid lower pelvic area ). Nothing aggravates the symptoms. She has tried nothing for the symptoms.    OB History    Gravida Para Term Preterm AB Living   9 3     6 3    SAB TAB Ectopic Multiple Live Births     6            Past Medical History:  Diagnosis Date  . Depression Dx 2010  . Hypertension Dx 2015  .  OCD (obsessive compulsive disorder)     Past Surgical History:  Procedure Laterality Date  . NO PAST SURGERIES      Family History  Problem Relation Age of Onset  . Cancer Mother   . Cancer Father     Social History  Substance Use Topics  . Smoking status: Never Smoker  . Smokeless tobacco: Never Used  . Alcohol use No    Allergies: No Known Allergies  Prescriptions Prior to Admission  Medication Sig Dispense Refill Last Dose  . Naproxen Sodium (ALEVE PO) Take 2 tablets by mouth 2 (two) times daily as needed.    06/26/2016 at Unknown time    Review of Systems  Constitutional: Negative.   HENT: Negative.   Eyes: Negative.   Respiratory: Negative.   Cardiovascular: Negative.   Gastrointestinal: Positive for abdominal pain (mid lower pelvic area ).  Endocrine: Negative.   Genitourinary: Positive for vaginal discharge (yellowish, fishy odor ).  Musculoskeletal: Negative.   Allergic/Immunologic: Negative.   Neurological: Negative.   Hematological: Negative.   Psychiatric/Behavioral: Negative.    Physical Exam   Blood pressure 150/80, pulse 73, temperature 99.4 F (37.4 C), temperature source Oral, resp. rate 17, weight 243 lb (110.2 kg), last menstrual period 06/02/2016.  Physical Exam  Constitutional: She is oriented to  person, place, and time. She appears well-developed and well-nourished.  HENT:  Head: Normocephalic and atraumatic.  Eyes: Conjunctivae and EOM are normal.  Neck: Normal range of motion. Neck supple.  Cardiovascular: Normal rate, regular rhythm and normal heart sounds.   Respiratory: Effort normal and breath sounds normal.  GI: Soft. Bowel sounds are normal. There is tenderness (mid lower umbilical area and pelvic area ).  Musculoskeletal: Normal range of motion.  Neurological: She is alert and oriented to person, place, and time. She has normal reflexes.  Skin: Skin is warm and dry.  Psychiatric: She has a normal mood and affect. Her behavior is  normal. Judgment and thought content normal.   Nurse swab for STI and other testing.  MAU Course  Procedures 1) UA- (ketones) Encouraged to increase fluid/water intake  2) Urine Preg (reviewed) negative  3) Wet Prep Reviewed (clue cells and WBC present)  4) GC/HIV- Pending   MDM Encouraged patient to increase fluid intake due to increased ketones on UA . Educated on the importance of obtaining a PCP/GYN for evaluation of conditions such as her presentations. Informed of the proper use of ED and the effectiveness of having a regular provider to provide comprehensive and continuity of care. Resources offered now that she has insurance. Patient will be given Flagyl for BV diagnosis.   Assessment and Plan  Patient will be discharged home with Flagyl. Instructed to complete entire course of antibiotics. Discussed the importance of no alcohol use with Flagyl. Encouraged to establish are with PCP and GYN. She is aware that other STI test are pending and she will receive a call if abnormalities are resulted. She will take ibuprofen for abdominal pain and cramping.   Chayil Gantt 06/27/2016, 12:07 PM

## 2016-06-28 LAB — HIV ANTIBODY (ROUTINE TESTING W REFLEX): HIV SCREEN 4TH GENERATION: NONREACTIVE

## 2016-06-28 LAB — GC/CHLAMYDIA PROBE AMP (~~LOC~~) NOT AT ARMC
Chlamydia: NEGATIVE
NEISSERIA GONORRHEA: NEGATIVE

## 2016-07-05 ENCOUNTER — Encounter: Payer: No Typology Code available for payment source | Admitting: Internal Medicine

## 2017-01-04 ENCOUNTER — Other Ambulatory Visit: Payer: Self-pay | Admitting: Internal Medicine

## 2017-01-04 DIAGNOSIS — Z1231 Encounter for screening mammogram for malignant neoplasm of breast: Secondary | ICD-10-CM

## 2017-01-15 ENCOUNTER — Ambulatory Visit: Payer: BLUE CROSS/BLUE SHIELD

## 2017-04-03 ENCOUNTER — Ambulatory Visit (INDEPENDENT_AMBULATORY_CARE_PROVIDER_SITE_OTHER): Payer: BLUE CROSS/BLUE SHIELD | Admitting: Specialist

## 2017-04-03 ENCOUNTER — Ambulatory Visit (INDEPENDENT_AMBULATORY_CARE_PROVIDER_SITE_OTHER): Payer: BLUE CROSS/BLUE SHIELD

## 2017-04-03 ENCOUNTER — Encounter (INDEPENDENT_AMBULATORY_CARE_PROVIDER_SITE_OTHER): Payer: Self-pay | Admitting: Specialist

## 2017-04-03 VITALS — BP 144/87 | HR 62 | Ht 68.0 in | Wt 244.0 lb

## 2017-04-03 DIAGNOSIS — M5136 Other intervertebral disc degeneration, lumbar region: Secondary | ICD-10-CM

## 2017-04-03 DIAGNOSIS — M48062 Spinal stenosis, lumbar region with neurogenic claudication: Secondary | ICD-10-CM | POA: Diagnosis not present

## 2017-04-03 DIAGNOSIS — M545 Low back pain: Secondary | ICD-10-CM | POA: Diagnosis not present

## 2017-04-03 DIAGNOSIS — M4807 Spinal stenosis, lumbosacral region: Secondary | ICD-10-CM

## 2017-04-03 DIAGNOSIS — M1631 Unilateral osteoarthritis resulting from hip dysplasia, right hip: Secondary | ICD-10-CM

## 2017-04-03 DIAGNOSIS — G9519 Other vascular myelopathies: Secondary | ICD-10-CM

## 2017-04-03 DIAGNOSIS — M4726 Other spondylosis with radiculopathy, lumbar region: Secondary | ICD-10-CM | POA: Diagnosis not present

## 2017-04-03 MED ORDER — GABAPENTIN 300 MG PO CAPS: 300.0000 mg | ORAL_CAPSULE | Freq: Every day | ORAL | 3 refills | Status: DC

## 2017-04-03 MED ORDER — OXAPROZIN 600 MG PO TABS: 600.0000 mg | ORAL_TABLET | Freq: Two times a day (BID) | ORAL | 3 refills | Status: DC

## 2017-04-03 NOTE — Progress Notes (Signed)
Office Visit Note   Patient: Megan [name]           Date of Birth: July 13, 1970           MRN: [id number] Visit Date: 04/03/2017              Requested by: Fleet Contras, MD [address] [place], [place] [address] PCP: Patient, No Pcp Per   Assessment & Plan: Visit Diagnoses:  1. Low back pain, unspecified back pain laterality, unspecified chronicity, with sciatica presence unspecified   2. Spinal stenosis of lumbosacral region   3. Neurogenic claudication   4. 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 Other spondylosis with radiculopathy, lumbar region   6. Degenerative disc disease, lumbar     Plan: Avoid bending, stooping and avoid lifting weights greater than 10 lbs. Avoid prolong standing and walking. Avoid frequent bending and stooping  No lifting greater than 10 lbs. May use ice or moist heat for pain. Weight loss is of benefit. Handicap license is approved. The main ways of treat osteoarthritis, that are found to be success. Weight loss helps to decrease pain. Exercise is important to maintaining cartilage and thickness and strengthening. NSAIDs like dayrpo or diclofenac are meds decreasing the inflamation. Ice is okay  In afternoon and evening and hot shower in the am Schedule for MRI to assess for lumbar spinal stenosis Change to daypro for anti inflamatory affect, stop diclofenac. Schedule appointment to see Dr. Allie Bossier to assess hips for severe OA right hip due to  dysplasia  Cane for the left hand to off load the stress on the right hip  Follow-Up Instructions: Return in about 2 years (around 04/04/2019) for Dr. Magnus Ivan for evaluation of severe hip osteoarthritis.   Orders:  Orders Placed This Encounter  Procedures  . XR Lumbar Spine 2-3 Views  . XR HIP UNILAT W OR W/O PELVIS 2-3 VIEWS RIGHT  . XR HIP UNILAT W OR W/O PELVIS 1V LEFT  . MR Lumbar Spine w/o contrast   Meds ordered this encounter  Medications  .  oxaprozin (DAYPRO) 600 MG tablet    Sig: Take 1 tablet (600 mg total) by mouth 2 (two) times daily after a meal.    Dispense:  60 tablet    Refill:  3  . gabapentin (NEURONTIN) 300 MG capsule    Sig: Take 1 capsule (300 mg total) by mouth at bedtime.    Dispense:  30 capsule    Refill:  3      Procedures: No procedures performed   Clinical Data: No additional findings.   Subjective: Chief Complaint  Patient presents with  . Lower Back - Pain    46 year old female with history of back pain and sciatica for years some pain as teenager and then in her early 29s began having increasing pain in her knees and back. Feels like a bolt of lightning She has to lift the right leg with her hands to lift it up. Pain in the right lower back with a tingling sensation. Standing on the right leg with pain, bone with hurting. Sitting to standing with stiffness, at  Night tooses and turns to relieve pain. Pain is always there and present with sleep. As soon as off work she is bent over. More of the pain is both in the back and the legs. Pain is worse with sitting and bending and stooping and driving. Also pain with lying down, tossing and turning  all the time. She puts a pillow to help prop her up. Bowel and bladder is normal, she can not walk a mile,  Has difficulty walking into the building at work, she works presently at [employer].     Review of Systems  Constitutional: Negative.   HENT: Negative.   Eyes: Negative.   Respiratory: Negative.   Cardiovascular: Negative.   Gastrointestinal: Negative.   Endocrine: Negative.   Genitourinary: Negative.   Musculoskeletal: Negative.   Skin: Negative.   Allergic/Immunologic: Negative.   Neurological: Negative.   Hematological: Negative.   Psychiatric/Behavioral: Negative.      Objective: Vital Signs: BP (!) 144/87 (BP Location: Left Arm, Patient Position: Sitting)   Pulse 62   Ht 5\' 8"  (1.727 m)   Wt 244 lb (110.7 kg)   BMI 37.10 kg/m    Physical Exam  Constitutional: She is oriented to person, place, and time. She appears well-developed and well-nourished.  HENT:  Head: Normocephalic and atraumatic.  Eyes: EOM are normal. Pupils are equal, round, and reactive to light.  Neck: Normal range of motion. Neck supple.  Pulmonary/Chest: Effort normal and breath sounds normal.  Abdominal: Soft. Bowel sounds are normal.  Neurological: She is alert and oriented to person, place, and time.  Skin: Skin is warm and dry.  Psychiatric: She has a normal mood and affect. Her behavior is normal. Judgment and thought content normal.    Right Hip Exam   Tenderness  The patient is experiencing tenderness in the greater trochanter and posterior.  Range of Motion  Abduction:  20 abnormal  Adduction:  15 abnormal  Extension:  20 abnormal  Flexion: 40  External rotation: 20  Internal rotation: 0   Muscle Strength  Abduction: 4/5  Adduction: 4/5  Flexion: 4/5   Tests  FABER: positive Ober: positive  Other  Erythema: absent Scars: absent   Left Hip Exam   Range of Motion  Abduction: 30  Adduction: 20  Extension: 10  Flexion: 110  External rotation: 30  Internal rotation: 0   Muscle Strength  Abduction: 4/5  Adduction: 4/5  Flexion: 4/5   Tests  FABER: positive Ober: positive  Other  Erythema: absent Scars: absent   Back Exam   Tenderness  The patient is experiencing tenderness in the lumbar.  Range of Motion  Extension: abnormal  Flexion: abnormal  Lateral bend right: normal  Lateral bend left: normal  Rotation right: normal  Rotation left: normal   Muscle Strength  The patient has normal back strength. Right Quadriceps:  5/5  Left Quadriceps:  5/5  Right Hamstrings:  5/5  Left Hamstrings:  5/5   Tests  Straight leg raise right: negative Straight leg raise left: negative  Reflexes  Patellar: normal Achilles: normal Babinski's sign: normal   Other  Toe walk: normal Heel walk:  normal Gait: Trendelenburg  Erythema: no back redness Scars: absent  Comments:  Weakness bilateral foot DF 4+/5 , Knee extension is normal, hip flexion strength is weak bilateral right greater than left.       Specialty Comments:  No specialty comments available.  Imaging: Xr Hip Unilat W Or W/o Pelvis 1v Left  Result Date: 04/03/2017 Left hip lateral radiograph with loss of the normal superolateral contour of the the left femoral head Neck interval with mild CAM effect suggests femoral acetabular impingement, joint line is well maintaines.   Xr Hip Unilat W Or W/o Pelvis 2-3 Views Right  Result Date: 04/03/2017 AP pelvis and lateral of the  right hip shows a shallow right acetabulum with uncoverage of the lateral 35-40 percent of the right femoral head, severe superolateral joint line narrowing with subchondral sclerosis and loss of the normal sphericity of the femoral head. 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  Xr Lumbar Spine 2-3 Views  Result Date: 04/03/2017 AP standing of the lumbar spine and lateral flexion and extension radiographs of the lumbar spine show degenerative disc narrowing L5-S1 greater than L4-5 greater than L3-4 no listhesis, no fracture There is sclerosis of the facets over the lower lumbar spine Right L4-5 and bilateral L5-S1.    PMFS History: Patient Active Problem List   Diagnosis Date Noted  . Chronic pain of right hip 07/20/2014  . Memory loss 07/20/2014  . Sciatica 01/25/2014  . Essential hypertension 01/25/2014  . Hair thinning 10/16/2013  . Depression 10/13/2013  . Anxiety state, unspecified 10/13/2013  . Leg cramps 10/13/2013  . Other screening mammogram 10/13/2013   Past Medical History:  Diagnosis Date  . Depression Dx 2010  . Hypertension Dx 2015  . OCD (obsessive compulsive disorder)     Family History  Problem Relation Age of Onset  . Cancer Mother   . Cancer Father      Past Surgical History:  Procedure Laterality Date  . NO PAST SURGERIES     Social History   Occupational History  . Not on file  Tobacco Use  . Smoking status: Never Smoker  . Smokeless tobacco: Never Used  Substance and Sexual Activity  . Alcohol use: No  . Drug use: No  . Sexual activity: Yes    Birth control/protection: None

## 2017-04-03 NOTE — Patient Instructions (Addendum)
Avoid bending, stooping and avoid lifting weights greater than 10 lbs. Avoid prolong standing and walking. Avoid frequent bending and stooping  No lifting greater than 10 lbs. May use ice or moist heat for pain. Weight loss is of benefit. Handicap license is approved. The main ways of treat osteoarthritis, that are found to be success. Weight loss helps to decrease pain. Exercise is important to maintaining cartilage and thickness and strengthening. NSAIDs like dayrpo or diclofenac are meds decreasing the inflamation. Ice is okay  In afternoon and evening and hot shower in the am Schedule for MRI to assess for lumbar spinal stenosis Change to daypro for anti inflamatory affect, stop diclofenac. Schedule appointment to see Dr. Allie Bossierhris Blackman to assess hips for severe OA right hip due to  dysplasia Cane for the left hand to off load the stress on the right hip

## 2017-04-18 ENCOUNTER — Encounter (INDEPENDENT_AMBULATORY_CARE_PROVIDER_SITE_OTHER): Payer: Self-pay | Admitting: Orthopaedic Surgery

## 2017-04-18 ENCOUNTER — Ambulatory Visit (INDEPENDENT_AMBULATORY_CARE_PROVIDER_SITE_OTHER): Payer: BLUE CROSS/BLUE SHIELD | Admitting: Orthopaedic Surgery

## 2017-04-18 DIAGNOSIS — M25551 Pain in right hip: Secondary | ICD-10-CM

## 2017-04-18 DIAGNOSIS — M1611 Unilateral primary osteoarthritis, right hip: Secondary | ICD-10-CM | POA: Diagnosis not present

## 2017-04-18 NOTE — Progress Notes (Signed)
Office Visit Note   Patient: Megan [name]           Date of Birth: 11/28/1970           MRN: [id number] Visit Date: 04/18/2017              Requested by: No referring provider defined for this encounter. PCP: Patient, No Pcp Per   Assessment & Plan: Visit Diagnoses:  1. Pain of right hip joint   2. Unilateral primary osteoarthritis, right hip     Plan: Given her x-ray findings and her clinical exam of the right hip showing significant right hip arthritis she may end up needing a hip replacement.  I talked her about this in detail showing her hip model and gone over x-rays and explaining the risk and benefits of this type of surgery.  She would like to think about this and I agree.  Also would like to set her up for an intra-articular steroid injection of her right hip by Dr. Alvester MorinNewton to see how this affects her clinically as well and she is agreeable to trying this.  We will work on setting this injection up and I will see her back myself in about a month from now to see how she is responded to this.  All questions and concerns were answered and addressed we can address this further at her next visit as well.  Follow-Up Instructions: Return in about 4 weeks (around 05/16/2017).   Orders:  No orders of the defined types were placed in this encounter.  No orders of the defined types were placed in this encounter.     Procedures: No procedures performed   Clinical Data: No additional findings.   Subjective: No chief complaint on file. Patient somewhat I am seeing for the first time but she is a patient of Dr. Otelia SergeantNitka sent me to evaluate her right hip.  She has been having worsening hip pain for some time now with locking and catching of her hip and this radiates down to her knee.  It does hurt in the groin as well.  There are x-rays in our system for me to review.  He has begun to detrimentally affect her activities of daily living as well as her quality of life and her mobility.   She is someone who is obese.  She reports a family history of arthritis in multiple family members.  Dr. Otelia SergeantNitka is following her for her back as well.  Marland Kitchen.  HPI  Review of Systems She currently denies any headache, chest pain, shortness of breath, fever, chills, nausea, vomiting  Objective: Vital Signs: There were no vitals taken for this visit.  Physical Exam She is alert and oriented x3 and in no acute distress Ortho Exam Examination of her right hip shows significant pain with internal/external rotation of the right hip.  Laterally supine leg lengths are equal but she is definitely hurting with rotation of her hip.  Her left hip exam is more normal. Specialty Comments:  No specialty comments available.  Imaging: No results found. An AP pelvis on our system that is independently reviewed by me does show significant joint space narrowing of the right hip when compared to the right and left hips.  There is also sclerotic changes in the femoral head and acetabulum.  PMFS History: Patient Active Problem List   Diagnosis Date Noted  . Unilateral primary osteoarthritis, right hip 04/18/2017  . Pain of right hip joint 04/18/2017  . Chronic pain  of right hip 07/20/2014  . Memory loss 07/20/2014  . Sciatica 01/25/2014  . Essential hypertension 01/25/2014  . Hair thinning 10/16/2013  . Depression 10/13/2013  . Anxiety state, unspecified 10/13/2013  . Leg cramps 10/13/2013  . Other screening mammogram 10/13/2013   Past Medical History:  Diagnosis Date  . Depression Dx 2010  . Hypertension Dx 2015  . OCD (obsessive compulsive disorder)     Family History  Problem Relation Age of Onset  . Cancer Mother   . Cancer Father     Past Surgical History:  Procedure Laterality Date  . NO PAST SURGERIES     Social History   Occupational History  . Not on file  Tobacco Use  . Smoking status: Never Smoker  . Smokeless tobacco: Never Used  Substance and Sexual Activity  . Alcohol use:  No  . Drug use: No  . Sexual activity: Yes    Birth control/protection: None

## 2017-04-24 ENCOUNTER — Other Ambulatory Visit: Payer: BLUE CROSS/BLUE SHIELD

## 2017-05-21 ENCOUNTER — Other Ambulatory Visit: Payer: Self-pay

## 2017-05-21 ENCOUNTER — Encounter (HOSPITAL_COMMUNITY): Payer: Self-pay | Admitting: Emergency Medicine

## 2017-05-21 ENCOUNTER — Emergency Department (HOSPITAL_COMMUNITY)
Admission: EM | Admit: 2017-05-21 | Discharge: 2017-05-21 | Disposition: A | Discharge status: Home / Self Care | Payer: BLUE CROSS/BLUE SHIELD | Attending: Emergency Medicine | Admitting: Emergency Medicine

## 2017-05-21 DIAGNOSIS — G8929 Other chronic pain: Secondary | ICD-10-CM | POA: Diagnosis not present

## 2017-05-21 DIAGNOSIS — M25519 Pain in unspecified shoulder: Secondary | ICD-10-CM | POA: Insufficient documentation

## 2017-05-21 DIAGNOSIS — M542 Cervicalgia: Secondary | ICD-10-CM | POA: Diagnosis not present

## 2017-05-21 DIAGNOSIS — M62838 Other muscle spasm: Secondary | ICD-10-CM | POA: Diagnosis not present

## 2017-05-21 DIAGNOSIS — M25551 Pain in right hip: Secondary | ICD-10-CM | POA: Insufficient documentation

## 2017-05-21 DIAGNOSIS — Z79899 Other long term (current) drug therapy: Secondary | ICD-10-CM | POA: Diagnosis not present

## 2017-05-21 DIAGNOSIS — I10 Essential (primary) hypertension: Secondary | ICD-10-CM | POA: Insufficient documentation

## 2017-05-21 DIAGNOSIS — M5440 Lumbago with sciatica, unspecified side: Secondary | ICD-10-CM | POA: Diagnosis not present

## 2017-05-21 NOTE — Discharge Instructions (Signed)
Please read instructions below. Apply heat to your neck for 20 minutes at a time. You can take your prescribed medications for pain and muscle spasm.  Schedule an appointment with your orthopedic specialist to discuss your ongoing pain. Return to the ER for new or concerning symptoms.

## 2017-05-21 NOTE — ED Triage Notes (Signed)
Pt. Stated, I've always had back pain and leg pain in my twenties but here in the last 2 days  Its been worse. My neck and shoulders it scares me to move. My knees hurt too.

## 2017-05-21 NOTE — ED Provider Notes (Signed)
[facility]   CSN: [id number] Arrival date & time: 05/21/17  1624     History   Chief Complaint Chief Complaint  Patient presents with  . Leg Pain  . Back Pain  . Shoulder Pain  . Knee Pain    HPI Megan [name] is a 47 y.o. female with past medical history of chronic right hip pain, chronic lower back pain, presenting to the ED with persistent bilateral hip pain and neck pain times 1 week.  Patient states pain feels similar to her arthritis in her hip.  States her pain waxes and wanes, especially with her workload and the weather.  She states her neck feels tight.  Has been prescribed gabapentin, Zanaflex, and Oxaprozin by her orthopedic doctor for pain.  She states she has had imaging of her hips and her lower back which show arthritis.  She denies any new numbness or tingling in extremity, bowel or bladder incontinence, fever.  Denies recent injury.  The history is provided by the patient.    Past Medical History:  Diagnosis Date  . Depression Dx 2010  . Hypertension Dx 2015  . OCD (obsessive compulsive disorder)     Patient Active Problem List   Diagnosis Date Noted  . Unilateral primary osteoarthritis, right hip 04/18/2017  . Pain of right hip joint 04/18/2017  . Chronic pain of right hip 07/20/2014  . Memory loss 07/20/2014  . Sciatica 01/25/2014  . Essential hypertension 01/25/2014  . Hair thinning 10/16/2013  . Depression 10/13/2013  . Anxiety state, unspecified 10/13/2013  . Leg cramps 10/13/2013  . Other screening mammogram 10/13/2013    Past Surgical History:  Procedure Laterality Date  . NO PAST SURGERIES      OB History    Gravida Para Term Preterm AB Living   9 3     6 3    SAB TAB Ectopic Multiple Live Births     6             Home Medications    Prior to Admission medications   Medication Sig Start Date End Date Taking? Authorizing Provider  gabapentin (NEURONTIN) 300 MG capsule Take 1  capsule (300 mg total) by mouth at bedtime. 04/03/17   Kerrin ChampagneNitka, James E, MD  meloxicam (MOBIC) 7.5 MG tablet TAKE 1 T  BY MOUTH  BID WITH FOOD AS NEEDED FOR PAIN AFTER Loma MessingFINISHING MEDROL DOSEPAK 02/19/17   [provider]  metroNIDAZOLE (FLAGYL) 500 MG tablet Take 1 tablet (500 mg total) by mouth 2 (two) times daily. 06/27/16   Rasch, Victorino DikeJennifer I, NP  Naproxen Sodium (ALEVE PO) Take 2 tablets by mouth 2 (two) times daily as needed.     [provider]  oxaprozin (DAYPRO) 600 MG tablet Take 1 tablet (600 mg total) by mouth 2 (two) times daily after a meal. 04/03/17   Kerrin ChampagneNitka, James E, MD  tiZANidine (ZANAFLEX) 4 MG tablet Take 4 mg by mouth every 6 (six) hours as needed for muscle spasms.    [provider]    Family History Family History  Problem Relation Age of Onset  . Cancer Mother   . Cancer Father     Social History Social History   Tobacco Use  . Smoking status: Never Smoker  . Smokeless tobacco: Never Used  Substance Use Topics  . Alcohol use: No  . Drug use: No     Allergies   Patient has no known allergies.   Review of  Systems Review of Systems  Constitutional: Negative for fever.  Musculoskeletal: Positive for arthralgias, back pain, myalgias and neck pain.  Neurological: Negative for weakness and numbness.  All other systems reviewed and are negative.    Physical Exam Updated Vital Signs BP 126/71 (BP Location: Right Arm)   Pulse 64   Temp 98.7 F (37.1 C) (Oral)   Resp 20   Ht 5\' 8"  (1.727 m)   Wt 111.1 kg (245 lb)   LMP 05/02/2017   SpO2 100%   BMI 37.25 kg/m   Physical Exam  Constitutional: She appears well-developed and well-nourished. No distress.  HENT:  Head: Normocephalic and atraumatic.  Eyes: Conjunctivae are normal.  Neck: Neck supple.  Cardiovascular: Normal rate and intact distal pulses.  Pulmonary/Chest: Effort normal.  Musculoskeletal:  No midline spinal or paraspinal tenderness, no bony step-offs, no gross  deformities. B/l trapezius spasm with associated tenderness.  Neck with normal range of motion. Bilateral hips with normal range of motion.  Normal gait.   Neurological:  Motor:  Normal tone. 5/5 in upper and lower extremities bilaterally including strong and equal grip strength and dorsiflexion/plantar flexion Sensory: Pinprick and light touch normal in all extremities.  Deep Tendon Reflexes: 2+ and symmetric in the biceps and patella Cerebellar: normal finger-to-nose with bilateral upper extremities Gait: normal gait and balance CV: distal pulses palpable throughout    Psychiatric: She has a normal mood and affect. Her behavior is normal.  Nursing note and vitals reviewed.    ED Treatments / Results  Labs (all labs ordered are listed, but only abnormal results are displayed) Labs Reviewed - No data to display  EKG  EKG Interpretation None       Radiology No results found.  Procedures Procedures (including critical care time)  Medications Ordered in ED Medications - No data to display   Initial Impression / Assessment and Plan / ED Course  I have reviewed the triage vital signs and the nursing notes.  Pertinent labs & imaging results that were available during my care of the patient were reviewed by me and considered in my medical decision making (see chart for details).     Patient presenting for chronic joint pain, as well as new muscle aches.  On exam, bilateral trapezius spasm palpated.  No spinal or paraspinal tenderness, no red flags, including afebrile, no bowel or bladder incontinence.  Normal neurologic exam.  No imaging indicated at this time.  Discussed symptomatic management at home and recommend patient continue taking her prescribed medications for joint pain, which include gabapentin, Zanaflex, and oxaprozin.  Recommend patient follow-up with her orthopedic specialist if pain persists.  Discussed results, findings, treatment and follow up. Patient  advised of return precautions. Patient verbalized understanding and agreed with plan.  Final Clinical Impressions(s) / ED Diagnoses   Final diagnoses:  Chronic pain of right hip  Muscle spasms of neck  Chronic low back pain with sciatica, sciatica laterality unspecified, unspecified back pain laterality    ED Discharge Orders    None       Robinson, [place] N, PA-C 05/21/17 2122    Nira Conn, MD 05/22/17 [phone]

## 2017-05-22 ENCOUNTER — Ambulatory Visit (INDEPENDENT_AMBULATORY_CARE_PROVIDER_SITE_OTHER): Payer: BLUE CROSS/BLUE SHIELD | Admitting: Orthopaedic Surgery

## 2017-05-23 ENCOUNTER — Ambulatory Visit (INDEPENDENT_AMBULATORY_CARE_PROVIDER_SITE_OTHER): Payer: BLUE CROSS/BLUE SHIELD | Admitting: Physical Medicine and Rehabilitation

## 2017-05-23 ENCOUNTER — Ambulatory Visit (INDEPENDENT_AMBULATORY_CARE_PROVIDER_SITE_OTHER): Payer: BLUE CROSS/BLUE SHIELD

## 2017-05-23 ENCOUNTER — Encounter (INDEPENDENT_AMBULATORY_CARE_PROVIDER_SITE_OTHER): Payer: Self-pay | Admitting: Physical Medicine and Rehabilitation

## 2017-05-23 DIAGNOSIS — M25551 Pain in right hip: Secondary | ICD-10-CM | POA: Diagnosis not present

## 2017-05-23 NOTE — Progress Notes (Signed)
Megan LernerDeshane [name] - 47 y.o. female MRN [id number]  Date of birth: 1971-01-27  Office Visit Note: Visit Date: 05/23/2017 PCP: Patient, No Pcp Per Referred by: No ref. provider found  Subjective: Chief Complaint  Patient presents with  . Right Shoulder - Pain  . Left Shoulder - Pain  . Right Knee - Pain  . Left Knee - Pain  . Right Thigh - Pain  . Left Thigh - Pain  . Lower Back - Pain   HPI: Megan [name] is a 47 year old female who is followed both by Dr. Otelia SergeantNitka and Dr. Magnus IvanBlackman in our practice.  Recently saw Dr. Magnus IvanBlackman for her right hip pain.  Neuritis.  Since her today for diagnostic and hopefully therapeutic anesthetic hip arthrogram on the right.  Well.  She was evaluated there but nothing new was found to follow-up with her orthopedic doctors.  We will go ahead today complete the right hip injection.  As of note however she reports chronic worsening really all over the body at this point in multiple joints.    ROS Otherwise per HPI.  Assessment & Plan: Visit Diagnoses:  1. Pain in right hip     Plan: Findings:  Diagnostic and hopefully therapeutic anesthetic hip arthrogram on the right.  Patient did not have much in the way of pain relief during the anesthetic phase.    Meds & Orders: No orders of the defined types were placed in this encounter.   Orders Placed This Encounter  Procedures  . Large Joint Inj: R hip joint  . XR C-ARM NO REPORT    Follow-up: Return for Dr. Magnus IvanBlackman.   Procedures: Large Joint Inj: R hip joint on 05/23/2017 2:18 PM Indications: pain and diagnostic evaluation Details: 22 G needle, anterior approach  Arthrogram: Yes  Medications: 3 mL bupivacaine 0.5 %; 80 mg triamcinolone acetonide 40 MG/ML Outcome: tolerated well, no immediate complications  Arthrogram demonstrated excellent flow of contrast throughout the joint surface without extravasation or obvious defect.  The patient did not have relief of symptoms during the anesthetic phase of  the injection.  Procedure, treatment alternatives, risks and benefits explained, specific risks discussed. Consent was given by the patient. Immediately prior to procedure a time out was called to verify the correct patient, procedure, equipment, support staff and site/side marked as required. Patient was prepped and draped in the usual sterile fashion.      No notes on file   Clinical History: No specialty comments available.  She reports that  has never smoked. she has never used smokeless tobacco. No results for input(s): HGBA1C, LABURIC in the last 8760 hours.  Objective:  VS:  HT:    WT:   BMI:     BP:   HR: bpm  TEMP: ( )  RESP:  Physical Exam  Musculoskeletal:  Pain with general palpation and movement of the right hip.    Ortho Exam Imaging: No results found.  Past Medical/Family/Surgical/Social History: Medications & Allergies reviewed per EMR Patient Active Problem List   Diagnosis Date Noted  . Unilateral primary osteoarthritis, right hip 04/18/2017  . Pain of right hip joint 04/18/2017  . Chronic pain of right hip 07/20/2014  . Memory loss 07/20/2014  . Sciatica 01/25/2014  . Essential hypertension 01/25/2014  . Hair thinning 10/16/2013  . Depression 10/13/2013  . Anxiety state, unspecified 10/13/2013  . Leg cramps 10/13/2013  . Other screening mammogram 10/13/2013   Past Medical History:  Diagnosis Date  . Depression Dx 2010  .  Hypertension Dx 2015  . OCD (obsessive compulsive disorder)    Family History  Problem Relation Age of Onset  . Cancer Mother   . Cancer Father    Past Surgical History:  Procedure Laterality Date  . NO PAST SURGERIES     Social History   Occupational History  . Not on file  Tobacco Use  . Smoking status: Never Smoker  . Smokeless tobacco: Never Used  Substance and Sexual Activity  . Alcohol use: No  . Drug use: No  . Sexual activity: Yes    Birth control/protection: None

## 2017-05-23 NOTE — Patient Instructions (Signed)

## 2017-05-23 NOTE — Progress Notes (Deleted)
Pt states a sharp constant pain in right hip that radiates down to both thighs, and both knees. Pt also states pain in both shoulders and lower back. Pt states weakness in right hand. Pt states symptoms has been going on for awhile and has gotten worse over the last few months. Pt states standing, sitting, and walking makes pain intolerable, nothing makes pain better. -Dye Allergy

## 2017-05-29 ENCOUNTER — Encounter (INDEPENDENT_AMBULATORY_CARE_PROVIDER_SITE_OTHER): Payer: Self-pay | Admitting: Orthopaedic Surgery

## 2017-05-29 ENCOUNTER — Ambulatory Visit (INDEPENDENT_AMBULATORY_CARE_PROVIDER_SITE_OTHER): Payer: BLUE CROSS/BLUE SHIELD | Admitting: Orthopaedic Surgery

## 2017-05-29 VITALS — Ht 68.0 in | Wt 245.0 lb

## 2017-05-29 DIAGNOSIS — M1611 Unilateral primary osteoarthritis, right hip: Secondary | ICD-10-CM

## 2017-05-29 NOTE — Progress Notes (Signed)
Patient is a very pleasant 47 year old well-known to us.  She has known severe arthritis of her right hip.  We sent her for an intra-articular injection and that did not help much at all.  This was with a steroid.  Her pain is still daily and it is detrimentally affecting her active daily living, quality of life, mobility.  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  We have already gone over x-rays before we did again today showing the severity of her arthritis in her right hip with significant joint space narrowing as well as sclerotic and cystic changes and periarticular osteophytes.  On exam she has significant pain with any attempts of internal or external hip rotation.  At this point I gave her our surgery schedulers card.  I am happy to keep her on any type of restricted work activities and even performed hip replacement surgery to have this done which I think would be helpful for her.  She Artie has a handle hip replacement surgery.  She has her surgery schedulers card and will let us know and again were happy to out any human resource FMLA paperwork for her if needed.

## 2017-06-03 MED ORDER — TRIAMCINOLONE ACETONIDE 40 MG/ML IJ SUSP
80.0000 mg | INTRAMUSCULAR | Status: AC | PRN
  Administered 2017-05-23: 80 mg via INTRA_ARTICULAR

## 2017-06-03 MED ORDER — BUPIVACAINE HCL 0.5 % IJ SOLN
3.0000 mL | INTRAMUSCULAR | Status: AC | PRN
  Administered 2017-05-23: 3 mL via INTRA_ARTICULAR

## 2017-06-20 ENCOUNTER — Ambulatory Visit (INDEPENDENT_AMBULATORY_CARE_PROVIDER_SITE_OTHER): Payer: BLUE CROSS/BLUE SHIELD | Admitting: Specialist

## 2017-06-25 ENCOUNTER — Encounter (INDEPENDENT_AMBULATORY_CARE_PROVIDER_SITE_OTHER): Payer: Self-pay | Admitting: Specialist

## 2017-06-25 ENCOUNTER — Ambulatory Visit (INDEPENDENT_AMBULATORY_CARE_PROVIDER_SITE_OTHER): Payer: BLUE CROSS/BLUE SHIELD | Admitting: Specialist

## 2017-06-25 VITALS — BP 120/68 | HR 67 | Ht 68.0 in | Wt 244.0 lb

## 2017-06-25 DIAGNOSIS — M5136 Other intervertebral disc degeneration, lumbar region: Secondary | ICD-10-CM

## 2017-06-25 DIAGNOSIS — M1611 Unilateral primary osteoarthritis, right hip: Secondary | ICD-10-CM

## 2017-06-25 MED ORDER — TIZANIDINE HCL 4 MG PO TABS: 4.0000 mg | ORAL_TABLET | Freq: Four times a day (QID) | ORAL | 1 refills | Status: DC | PRN

## 2017-06-25 NOTE — Patient Instructions (Addendum)
Avoid frequent bending and stooping  No lifting greater than 10 lbs. May use ice or moist heat for pain. Weight loss is of benefit. Handicap license is approved.   Hip is suffering from osteoarthritis, only real proven treatments are Weight loss, NSIADs like meloxicam and exercise. Well padded shoes help.   June 25, 2017  Patient:  Megan [name] Bovenzi Date of Birth:  Jun 23, 1970 Date of Visit:  06/25/2017  To Whom it May Concern:  Megan [name] Gehring was seen in our office on 06/25/2017 andShe  may return to work on 07/01/2017. Avoid frequent bending and stooping  No lifting greater than 10 lbs. May use ice or moist heat for pain. Weight loss is of benefit. Handicap license is approved.   Hip is suffering from osteoarthritis, only real proven treatments are Weight loss, NSIADs like meloxicam and exercise. Well padded shoes help.    Sincerely,  Vira BrownsJames Nitka, MD  06/25/2017, 3:40 PM

## 2017-06-25 NOTE — Progress Notes (Addendum)
Office Visit Note   Patient: Megan [name]           Date of Birth: [birth year]           MRN: [id number] Visit Date: 06/25/2017              Requested by: No referring provider defined for this encounter. PCP: Fleet Contras, MD   Assessment & Plan: Visit Diagnoses:  1. Unilateral primary osteoarthritis, right hip   2. Other intervertebral disc degeneration, lumbar region   47 year old female being seen for back and leg pain. Seen in the interval by Dr.Blackman for severe osteoarthritis of the  Right hip and was told that she likely needs a right hip replacement. DId not undergo MRI of the lumbar spine because She could not afford the copay. Xrays demonstrate DDD L3-4, L4-5 and L5-S1. She is requesting notes for out of work Due to back and hip. I explained to [name] that she has arthritis in her back and her hip. How long she will work before Intervention is up to her but I can not continue to write out of work excuses without understanding what is causing her  Impairment. I can not recommend surgery or other treatment for her back without an imaging study. She relates that she Wishes to proceed with hip surgery, I will refer her to Dr. Magnus Ivan, an appointment spot is open for tomorrow AM. I am Reordering the MRI and seeing if the radiology suite will offer a payment program. Her diagnosis is lumbar degenerative disc  Disease I have no other studies to evaluate or document if there is a nerve compression syndrome. Work note till 2/25/  Plan: Avoid frequent bending and stooping  No lifting greater than 10 lbs. May use ice or moist heat for pain. Weight loss is of benefit. Handicap license is approved.   Hip is suffering from osteoarthritis, only real proven treatments are Weight loss, NSIADs like meloxicam and exercise. Well padded shoes help. Letter concerning inability to return to work:  June 25, 2017  Patient:  [name] Date of Birth:  1971-04-17 Date of  Visit:  06/25/2017  To Whom it May Concern:  Megan [name] was seen in our office on 06/25/2017 andShe  may return to work on 07/01/2017. Avoid frequent bending and stooping  No lifting greater than 10 lbs. May use ice or moist heat for pain. Weight loss is of benefit. Handicap license is approved.   Hip is suffering from osteoarthritis, only real proven treatments are Weight loss, NSIADs like meloxicam and exercise. Well padded shoes help.    Sincerely,  Vira Browns, MD  06/25/2017, 3:40 PM    Follow-Up Instructions: Return in about 4 weeks (around 07/23/2017) for See Dr. Magnus Ivan for surgery..   Orders:  Orders Placed This Encounter  Procedures  . MR Lumbar Spine w/o contrast   Meds ordered this encounter  Medications  . DISCONTD: tiZANidine (ZANAFLEX) 4 MG tablet    Sig: Take 1 tablet (4 mg total) by mouth every 6 (six) hours as needed for muscle spasms.    Dispense:  30 tablet    Refill:  1      Procedures: No procedures performed   Clinical Data: No additional findings.   Subjective: Chief Complaint  Patient presents with  . Lower Back - Pain  . Right Leg - Pain    47 year old female with persistent back pain with radiation into the right buttock and both legs.  Into the anterior left thigh to the knee and on the right side is in the front and the  Lateral right thigh. The real pain that almost brings her down is in the left anterior thigh. She has not had an MRI and remains working daily, but is not following her restrictions. 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 She feels a pressure sensation like some one is pushing in on her back, on scale of 1-10 right now is a "6". But when she goes to  Stand and walk she is having pain on a level of "17". Has to have help to get up and standing and walking. Has children at home 6626,6221,45 years of age.     Review of Systems  Constitutional: Negative.   HENT: Negative.   Eyes: Negative.   Respiratory:  Negative.   Cardiovascular: Negative.   Gastrointestinal: Negative.   Endocrine: Negative.   Genitourinary: Negative.   Musculoskeletal: Negative.   Skin: Negative.   Allergic/Immunologic: Negative.   Neurological: Negative.   Hematological: Negative.   Psychiatric/Behavioral: Negative.      Objective: Vital Signs: BP 120/68 (BP Location: Left Arm, Patient Position: Sitting)   Pulse 67   Ht 5\' 8"  (1.727 m)   Wt 244 lb (110.7 kg)   BMI 37.10 kg/m   Physical Exam  Constitutional: She is oriented to person, place, and time. She appears well-developed and well-nourished.  HENT:  Head: Normocephalic and atraumatic.  Eyes: EOM are normal. Pupils are equal, round, and reactive to light.  Neck: Normal range of motion. Neck supple.  Pulmonary/Chest: Effort normal and breath sounds normal.  Abdominal: Soft. Bowel sounds are normal.  Musculoskeletal: Normal range of motion.  Neurological: She is alert and oriented to person, place, and time.  Skin: Skin is warm and dry.  Psychiatric: She has a normal mood and affect. Her behavior is normal. Judgment and thought content normal.    Ortho Exam  Specialty Comments:  No specialty comments available.  Imaging: No results found.   PMFS History: Patient Active Problem List   Diagnosis Date Noted  . Status post total replacement of right hip 07/19/2017  . Unilateral primary osteoarthritis, right hip 04/18/2017  . Pain of right hip joint 04/18/2017  . Chronic pain of right hip 07/20/2014  . Memory loss 07/20/2014  . Sciatica 01/25/2014  . Essential hypertension 01/25/2014  . Hair thinning 10/16/2013  . Depression 10/13/2013  . Anxiety state, unspecified 10/13/2013  . Leg cramps 10/13/2013  . Other screening mammogram 10/13/2013   Past Medical History:  Diagnosis Date  . Arthritis   . Complication of anesthesia    the last surgeries she has had patient states she woke up during surgery  . Depression Dx 2010  .  Hypertension Dx 2015   last time meds taken - 2016   . OCD (obsessive compulsive disorder)   . PONV (postoperative nausea and vomiting)   . Tuberculosis    latent TB- 2000 - received treatment     Family History  Problem Relation Age of Onset  . Cancer Mother   . Cancer Father     Past Surgical History:  Procedure Laterality Date  . COLONOSCOPY    . NO PAST SURGERIES    . TOOTH EXTRACTION    . TOTAL HIP ARTHROPLASTY Right 07/19/2017   Procedure: RIGHT TOTAL HIP ARTHROPLASTY ANTERIOR APPROACH;  Surgeon: Kathryne HitchBlackman, Christopher Y, MD;  Location: [facility];  Service: Orthopedics;  Laterality: Right;   Social History  Occupational History  . Not on file  Tobacco Use  . Smoking status: Never Smoker  . Smokeless tobacco: Never Used  Substance and Sexual Activity  . Alcohol use: No  . Drug use: No  . Sexual activity: Yes    Birth control/protection: None

## 2017-06-26 ENCOUNTER — Ambulatory Visit (INDEPENDENT_AMBULATORY_CARE_PROVIDER_SITE_OTHER): Payer: BLUE CROSS/BLUE SHIELD | Admitting: Orthopaedic Surgery

## 2017-06-26 ENCOUNTER — Encounter (INDEPENDENT_AMBULATORY_CARE_PROVIDER_SITE_OTHER): Payer: Self-pay | Admitting: Orthopaedic Surgery

## 2017-06-26 DIAGNOSIS — M1611 Unilateral primary osteoarthritis, right hip: Secondary | ICD-10-CM | POA: Diagnosis not present

## 2017-06-26 NOTE — Progress Notes (Signed)
The patient is a very pleasant 47 year old well-known to me.  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  This is been confirmed with x-rays as well as clinical exam.  At this point she is tried and failed all forms of conservative treatment for over a year now.  She has had activity modification, has tried weight loss, has tried anti-inflammatories as well as offloading her hip.  She is also had an intra-articular steroid injection which did help alleviate things but only for short amount of time.  She is someone who works on her feet all day long.  Her pain is only in the groin.  It is detrimentally affecting her activity living, quality of life, mobility.  It is 10 out of 10 at this standpoint.  At her last visit I was able to go over her x-rays and the x-rays show complete loss of superior lateral joint space of the hip with sclerotic changes in the hip ball and socket and periarticular osteophytes.  This is all the right hip.  Her left hip appears normal x-rays.  On physical exam her left hip exam is normal.  Her right hip shows severe pain with attempts of internal and external rotation.  Again we went over the details of fibrillation surgery as well as a thorough discussion of the risk and benefits of the surgery we talked about her intraoperative and postoperative course and what this involves.  She has been out of work for the last 4 days and would like to have a note returning to work this Friday.  I have told her to get other human resource FMLA paperwork together because we would need to have her out of work completely for anywhere from 4weeks to 3 months postoperative depending on her recovery.  All questions concerns were answered and addressed.  We will see her back in 2 weeks after surgery.

## 2017-07-05 NOTE — Progress Notes (Signed)
Please place orders in Epic as patient is being scheduled for a pre-op appointment! Thank you! 

## 2017-07-08 NOTE — Progress Notes (Signed)
Please place orders in Epic as patient has a pre-op appointment on 07/12/2017 at 1 pm! Thank you!

## 2017-07-09 NOTE — Progress Notes (Signed)
Need orders in epic.  Surgery on 07/19/2017.  Preop on 07/12/2017.

## 2017-07-09 NOTE — Patient Instructions (Addendum)
Campbell LernerDeshane Spangler  07/09/2017   Your procedure is scheduled on: 07/19/2017   [facility]  Report to admitting at    0800 AM   Call this number if you have problems the morning of surgery [phone]   Remember: Do not eat food or drink liquids :After Midnight.     Take these medicines the morning of surgery with A SIP OF WATER: none                                 You may not have any metal on your body including hair pins and              piercings  Do not wear jewelry, make-up, lotions, powders or perfumes, deodorant             Do not wear nail polish.  Do not shave  48 hours prior to surgery.              Do not bring valuables to the hospital.  IS NOT             RESPONSIBLE   FOR VALUABLES.  Contacts, dentures or bridgework may not be worn into surgery.  Leave suitcase in the car. After surgery it may be brought to your room.                     Please read over the following fact sheets you were given: _____________________________________________________________________             [facility] Pre[facility] Before surgery, you can play an important role.  Because skin is not sterile, your skin needs to be as free of germs as possible.  You can reduce the number of germs on your skin by washing with CHG (chlorahexidine gluconate) soap before surgery.  CHG is an antiseptic cleaner which kills germs and bonds with the skin to continue killing germs even after washing. Please DO NOT use if you have an allergy to CHG or antibacterial soaps.  If your skin becomes reddened/irritated stop using the CHG and inform your nurse when you arrive at Short Stay. Do not shave (including legs and underarms) for at least 48 hours prior to the first CHG shower.  You may shave your face/neck. Please follow these instructions carefully:  1.  Shower with CHG Soap the night before surgery and the  morning of Surgery.  2.  If  you choose to wash your hair, wash your hair first as usual with your  normal  shampoo.  3.  After you shampoo, rinse your hair and body thoroughly to remove the  shampoo.                           4.  Use CHG as you would any other liquid soap.  You can apply chg directly  to the skin and wash                       Gently with a scrungie or clean washcloth.  5.  Apply the CHG Soap to your body ONLY FROM THE NECK DOWN.   Do not use on face/ open  Wound or open sores. Avoid contact with eyes, ears mouth and genitals (private parts).                       Wash face,  Genitals (private parts) with your normal soap.             6.  Wash thoroughly, paying special attention to the area where your surgery  will be performed.  7.  Thoroughly rinse your body with warm water from the neck down.  8.  DO NOT shower/wash with your normal soap after using and rinsing off  the CHG Soap.                9.  Pat yourself dry with a clean towel.            10.  Wear clean pajamas.            11.  Place clean sheets on your bed the night of your first shower and do not  sleep with pets. Day of Surgery : Do not apply any lotions/deodorants the morning of surgery.  Please wear clean clothes to the hospital/surgery center.  FAILURE TO FOLLOW THESE INSTRUCTIONS MAY RESULT IN THE CANCELLATION OF YOUR SURGERY PATIENT SIGNATURE_________________________________  NURSE SIGNATURE__________________________________  ________________________________________________________________________   Adam Phenix  An incentive spirometer is a tool that can help keep your lungs clear and active. This tool measures how well you are filling your lungs with each breath. Taking long deep breaths may help reverse or decrease the chance of developing breathing (pulmonary) problems (especially infection) following:  A long period of time when you are unable to move or be active. BEFORE THE PROCEDURE    If the spirometer includes an indicator to show your best effort, your nurse or respiratory therapist will set it to a desired goal.  If possible, sit up straight or lean slightly forward. Try not to slouch.  Hold the incentive spirometer in an upright position. INSTRUCTIONS FOR USE  1. Sit on the edge of your bed if possible, or sit up as far as you can in bed or on a chair. 2. Hold the incentive spirometer in an upright position. 3. Breathe out normally. 4. Place the mouthpiece in your mouth and seal your lips tightly around it. 5. Breathe in slowly and as deeply as possible, raising the piston or the ball toward the top of the column. 6. Hold your breath for 3-5 seconds or for as long as possible. Allow the piston or ball to fall to the bottom of the column. 7. Remove the mouthpiece from your mouth and breathe out normally. 8. Rest for a few seconds and repeat Steps 1 through 7 at least 10 times every 1-2 hours when you are awake. Take your time and take a few normal breaths between deep breaths. 9. The spirometer may include an indicator to show your best effort. Use the indicator as a goal to work toward during each repetition. 10. After each set of 10 deep breaths, practice coughing to be sure your lungs are clear. If you have an incision (the cut made at the time of surgery), support your incision when coughing by placing a pillow or rolled up towels firmly against it. Once you are able to get out of bed, walk around indoors and cough well. You may stop using the incentive spirometer when instructed by your caregiver.  RISKS AND COMPLICATIONS  Take your time so you do not get  dizzy or light-headed.  If you are in pain, you may need to take or ask for pain medication before doing incentive spirometry. It is harder to take a deep breath if you are having pain. AFTER USE  Rest and breathe slowly and easily.  It can be helpful to keep track of a log of your progress. Your caregiver  can provide you with a simple table to help with this. If you are using the spirometer at home, follow these instructions: [place] IF:   You are having difficultly using the spirometer.  You have trouble using the spirometer as often as instructed.  Your pain medication is not giving enough relief while using the spirometer.  You develop fever of 100.5 F (38.1 C) or higher. SEEK IMMEDIATE MEDICAL CARE IF:   You cough up bloody sputum that had not been present before.  You develop fever of 102 F (38.9 C) or greater.  You develop worsening pain at or near the incision site. MAKE SURE YOU:   Understand these instructions.  Will watch your condition.  Will get help right away if you are not doing well or get worse. Document Released: 09/03/2006 Document Revised: 07/16/2011 Document Reviewed: 11/04/2006 [facility] Patient Information 2014 [place], [place].   ________________________________________________________________________

## 2017-07-10 ENCOUNTER — Other Ambulatory Visit (INDEPENDENT_AMBULATORY_CARE_PROVIDER_SITE_OTHER): Payer: Self-pay | Admitting: Physician Assistant

## 2017-07-12 ENCOUNTER — Encounter (HOSPITAL_COMMUNITY): Payer: Self-pay

## 2017-07-12 ENCOUNTER — Other Ambulatory Visit: Payer: Self-pay

## 2017-07-12 ENCOUNTER — Encounter (HOSPITAL_COMMUNITY)
Admission: RE | Admit: 2017-07-12 | Discharge: 2017-07-12 | Disposition: A | Discharge status: Home / Self Care | Payer: BLUE CROSS/BLUE SHIELD | Source: Ambulatory Visit | Attending: Orthopaedic Surgery | Admitting: Orthopaedic Surgery

## 2017-07-12 DIAGNOSIS — Z01818 Encounter for other preprocedural examination: Secondary | ICD-10-CM | POA: Diagnosis not present

## 2017-07-12 DIAGNOSIS — R9431 Abnormal electrocardiogram [ECG] [EKG]: Secondary | ICD-10-CM | POA: Diagnosis not present

## 2017-07-12 DIAGNOSIS — M1611 Unilateral primary osteoarthritis, right hip: Secondary | ICD-10-CM | POA: Insufficient documentation

## 2017-07-12 DIAGNOSIS — R001 Bradycardia, unspecified: Secondary | ICD-10-CM | POA: Insufficient documentation

## 2017-07-12 HISTORY — DX: Adverse effect of unspecified anesthetic, initial encounter: T41.45XA

## 2017-07-12 HISTORY — DX: Other specified postprocedural states: R11.2

## 2017-07-12 HISTORY — DX: Other specified postprocedural states: Z98.890

## 2017-07-12 HISTORY — DX: Other complications of anesthesia, initial encounter: T88.59XA

## 2017-07-12 HISTORY — DX: Unspecified osteoarthritis, unspecified site: M19.90

## 2017-07-12 HISTORY — DX: Respiratory tuberculosis unspecified: A15.9

## 2017-07-12 LAB — SURGICAL PCR SCREEN
MRSA, PCR: NEGATIVE
STAPHYLOCOCCUS AUREUS: NEGATIVE

## 2017-07-12 LAB — BASIC METABOLIC PANEL
Anion gap: 7 (ref 5–15)
BUN: 10 mg/dL (ref 6–20)
CHLORIDE: 108 mmol/L (ref 101–111)
CO2: 27 mmol/L (ref 22–32)
Calcium: 8.9 mg/dL (ref 8.9–10.3)
Creatinine, Ser: 0.8 mg/dL (ref 0.44–1.00)
GFR calc Af Amer: >60 mL/min (ref >60)
GFR calc non Af Amer: >60 mL/min (ref >60)
Glucose, Bld: 87 mg/dL (ref 65–99)
POTASSIUM: 3.7 mmol/L (ref 3.5–5.1)
SODIUM: 142 mmol/L (ref 135–145)

## 2017-07-12 LAB — CBC
HEMATOCRIT: 38.8 % (ref 36.0–46.0)
Hemoglobin: 12.4 g/dL (ref 12.0–15.0)
MCH: 27.6 pg (ref 26.0–34.0)
MCHC: 32 g/dL (ref 30.0–36.0)
MCV: 86.2 fL (ref 78.0–100.0)
Platelets: 214 10*3/uL (ref 150–400)
RBC: 4.5 MIL/uL (ref 3.87–5.11)
RDW: 12.7 % (ref 11.5–15.5)
WBC: 3.7 10*3/uL — AB (ref 4.0–10.5)

## 2017-07-12 LAB — HCG, SERUM, QUALITATIVE: Preg, Serum: NEGATIVE

## 2017-07-13 LAB — ABO/RH: ABO/RH(D): A POS

## 2017-07-15 NOTE — Progress Notes (Signed)
Final EKg done 07/12/2017-epic  

## 2017-07-18 MED ORDER — TRANEXAMIC ACID 1000 MG/10ML IV SOLN
1000.0000 mg | INTRAVENOUS | Status: AC
  Administered 2017-07-19: 1000 mg via INTRAVENOUS
  Filled 2017-07-18: qty 1100

## 2017-07-19 ENCOUNTER — Encounter (HOSPITAL_COMMUNITY): Payer: Self-pay | Admitting: *Deleted

## 2017-07-19 ENCOUNTER — Encounter (HOSPITAL_COMMUNITY)
Admission: RE | Disposition: A | Discharge status: Home Health | Payer: Self-pay | Source: Ambulatory Visit | Attending: Orthopaedic Surgery

## 2017-07-19 ENCOUNTER — Inpatient Hospital Stay (HOSPITAL_COMMUNITY)
Admission: RE | Admit: 2017-07-19 | Discharge: 2017-07-22 | DRG: 470 | Disposition: A | Discharge status: Home Health | Payer: BLUE CROSS/BLUE SHIELD | Source: Ambulatory Visit | Attending: Orthopaedic Surgery | Admitting: Orthopaedic Surgery

## 2017-07-19 ENCOUNTER — Inpatient Hospital Stay (HOSPITAL_COMMUNITY): Payer: BLUE CROSS/BLUE SHIELD

## 2017-07-19 ENCOUNTER — Inpatient Hospital Stay (HOSPITAL_COMMUNITY): Payer: BLUE CROSS/BLUE SHIELD | Admitting: Anesthesiology

## 2017-07-19 ENCOUNTER — Other Ambulatory Visit: Payer: Self-pay

## 2017-07-19 DIAGNOSIS — E669 Obesity, unspecified: Secondary | ICD-10-CM | POA: Diagnosis present

## 2017-07-19 DIAGNOSIS — Z419 Encounter for procedure for purposes other than remedying health state, unspecified: Secondary | ICD-10-CM

## 2017-07-19 DIAGNOSIS — Z6836 Body mass index (BMI) 36.0-36.9, adult: Secondary | ICD-10-CM | POA: Diagnosis not present

## 2017-07-19 DIAGNOSIS — F329 Major depressive disorder, single episode, unspecified: Secondary | ICD-10-CM | POA: Diagnosis present

## 2017-07-19 DIAGNOSIS — Z8611 Personal history of tuberculosis: Secondary | ICD-10-CM

## 2017-07-19 DIAGNOSIS — F429 Obsessive-compulsive disorder, unspecified: Secondary | ICD-10-CM | POA: Diagnosis present

## 2017-07-19 DIAGNOSIS — F411 Generalized anxiety disorder: Secondary | ICD-10-CM | POA: Diagnosis present

## 2017-07-19 DIAGNOSIS — I10 Essential (primary) hypertension: Secondary | ICD-10-CM | POA: Diagnosis present

## 2017-07-19 DIAGNOSIS — M1611 Unilateral primary osteoarthritis, right hip: Secondary | ICD-10-CM | POA: Diagnosis present

## 2017-07-19 DIAGNOSIS — Z96641 Presence of right artificial hip joint: Secondary | ICD-10-CM

## 2017-07-19 HISTORY — PX: TOTAL HIP ARTHROPLASTY: SHX124

## 2017-07-19 LAB — TYPE AND SCREEN
ABO/RH(D): A POS
Antibody Screen: NEGATIVE

## 2017-07-19 SURGERY — ARTHROPLASTY, HIP, TOTAL, ANTERIOR APPROACH
Anesthesia: General | Site: Hip | Laterality: Right

## 2017-07-19 MED ORDER — MENTHOL 3 MG MT LOZG: 1.0000 | LOZENGE | OROMUCOSAL | Status: DC | PRN

## 2017-07-19 MED ORDER — FENTANYL CITRATE (PF) 250 MCG/5ML IJ SOLN
INTRAMUSCULAR | Status: AC
  Filled 2017-07-19: qty 5

## 2017-07-19 MED ORDER — PHENOL 1.4 % MT LIQD: 1.0000 | OROMUCOSAL | Status: DC | PRN

## 2017-07-19 MED ORDER — PROMETHAZINE HCL 25 MG/ML IJ SOLN: 6.2500 mg | INTRAMUSCULAR | Status: DC | PRN

## 2017-07-19 MED ORDER — KETAMINE HCL 10 MG/ML IJ SOLN
INTRAMUSCULAR | Status: AC
  Filled 2017-07-19: qty 1

## 2017-07-19 MED ORDER — KETAMINE HCL 10 MG/ML IJ SOLN
INTRAMUSCULAR | Status: DC | PRN
  Administered 2017-07-19 (×5): 10 mg via INTRAVENOUS

## 2017-07-19 MED ORDER — ONDANSETRON HCL 4 MG/2ML IJ SOLN
INTRAMUSCULAR | Status: AC
  Filled 2017-07-19: qty 2

## 2017-07-19 MED ORDER — METOCLOPRAMIDE HCL 5 MG PO TABS: 5.0000 mg | ORAL_TABLET | Freq: Three times a day (TID) | ORAL | Status: DC | PRN

## 2017-07-19 MED ORDER — SUGAMMADEX SODIUM 500 MG/5ML IV SOLN
INTRAVENOUS | Status: DC | PRN
  Administered 2017-07-19: 350 mg via INTRAVENOUS

## 2017-07-19 MED ORDER — LIDOCAINE 2% (20 MG/ML) 5 ML SYRINGE
INTRAMUSCULAR | Status: AC
  Filled 2017-07-19: qty 15

## 2017-07-19 MED ORDER — LIDOCAINE 2% (20 MG/ML) 5 ML SYRINGE
INTRAMUSCULAR | Status: AC
  Filled 2017-07-19: qty 5

## 2017-07-19 MED ORDER — MIDAZOLAM HCL 2 MG/2ML IJ SOLN
INTRAMUSCULAR | Status: AC
  Filled 2017-07-19: qty 2

## 2017-07-19 MED ORDER — HYDROMORPHONE HCL 1 MG/ML IJ SOLN
0.2500 mg | INTRAMUSCULAR | Status: DC | PRN
  Administered 2017-07-19 (×4): 0.5 mg via INTRAVENOUS

## 2017-07-19 MED ORDER — SUGAMMADEX SODIUM 500 MG/5ML IV SOLN
INTRAVENOUS | Status: AC
  Filled 2017-07-19: qty 5

## 2017-07-19 MED ORDER — CHLORHEXIDINE GLUCONATE 4 % EX LIQD: 60.0000 mL | Freq: Once | CUTANEOUS | Status: DC

## 2017-07-19 MED ORDER — PROPOFOL 10 MG/ML IV BOLUS
INTRAVENOUS | Status: AC
  Filled 2017-07-19: qty 40

## 2017-07-19 MED ORDER — PROPOFOL 10 MG/ML IV BOLUS
INTRAVENOUS | Status: AC
  Filled 2017-07-19: qty 20

## 2017-07-19 MED ORDER — ZOLPIDEM TARTRATE 5 MG PO TABS: 5.0000 mg | ORAL_TABLET | Freq: Every evening | ORAL | Status: DC | PRN

## 2017-07-19 MED ORDER — SODIUM CHLORIDE 0.9 % IR SOLN
Status: DC | PRN
  Administered 2017-07-19: 1000 mL

## 2017-07-19 MED ORDER — ROCURONIUM BROMIDE 100 MG/10ML IV SOLN
INTRAVENOUS | Status: DC | PRN
  Administered 2017-07-19: 50 mg via INTRAVENOUS
  Administered 2017-07-19: 20 mg via INTRAVENOUS

## 2017-07-19 MED ORDER — ONDANSETRON HCL 4 MG PO TABS: 4.0000 mg | ORAL_TABLET | Freq: Four times a day (QID) | ORAL | Status: DC | PRN

## 2017-07-19 MED ORDER — DEXAMETHASONE SODIUM PHOSPHATE 10 MG/ML IJ SOLN
INTRAMUSCULAR | Status: DC | PRN
  Administered 2017-07-19: 10 mg via INTRAVENOUS

## 2017-07-19 MED ORDER — ASPIRIN EC 325 MG PO TBEC
325.0000 mg | DELAYED_RELEASE_TABLET | Freq: Two times a day (BID) | ORAL | Status: DC
  Administered 2017-07-19 – 2017-07-22 (×6): 325 mg via ORAL
  Filled 2017-07-19 (×6): qty 1

## 2017-07-19 MED ORDER — POLYETHYLENE GLYCOL 3350 17 G PO PACK: 17.0000 g | PACK | Freq: Every day | ORAL | Status: DC | PRN

## 2017-07-19 MED ORDER — OXYCODONE HCL 5 MG PO TABS
5.0000 mg | ORAL_TABLET | ORAL | Status: DC | PRN
  Administered 2017-07-19 (×2): 5 mg via ORAL
  Administered 2017-07-20: 18:00:00 10 mg via ORAL
  Administered 2017-07-20: 09:00:00 5 mg via ORAL
  Administered 2017-07-21: 10 mg via ORAL
  Filled 2017-07-19 (×2): qty 2
  Filled 2017-07-19: qty 1
  Filled 2017-07-19: qty 2

## 2017-07-19 MED ORDER — HYDROMORPHONE HCL 1 MG/ML IJ SOLN
0.5000 mg | INTRAMUSCULAR | Status: DC | PRN
  Administered 2017-07-19 – 2017-07-20 (×3): 1 mg via INTRAVENOUS
  Administered 2017-07-21: 14:00:00 0.5 mg via INTRAVENOUS
  Filled 2017-07-19 (×4): qty 1

## 2017-07-19 MED ORDER — MIDAZOLAM HCL 5 MG/5ML IJ SOLN
INTRAMUSCULAR | Status: DC | PRN
  Administered 2017-07-19: 2 mg via INTRAVENOUS

## 2017-07-19 MED ORDER — ROCURONIUM BROMIDE 10 MG/ML (PF) SYRINGE
PREFILLED_SYRINGE | INTRAVENOUS | Status: AC
  Filled 2017-07-19: qty 5

## 2017-07-19 MED ORDER — METHOCARBAMOL 1000 MG/10ML IJ SOLN
500.0000 mg | Freq: Four times a day (QID) | INTRAVENOUS | Status: DC | PRN
  Administered 2017-07-19: 500 mg via INTRAVENOUS
  Filled 2017-07-19: qty 550

## 2017-07-19 MED ORDER — PROPOFOL 10 MG/ML IV BOLUS
INTRAVENOUS | Status: DC | PRN
  Administered 2017-07-19: 150 mg via INTRAVENOUS

## 2017-07-19 MED ORDER — SODIUM CHLORIDE 0.9 % IV SOLN
INTRAVENOUS | Status: DC
  Administered 2017-07-19: 1000 mL via INTRAVENOUS
  Administered 2017-07-20: 16:00:00 via INTRAVENOUS

## 2017-07-19 MED ORDER — ALUM & MAG HYDROXIDE-SIMETH 200-200-20 MG/5ML PO SUSP: 30.0000 mL | ORAL | Status: DC | PRN

## 2017-07-19 MED ORDER — FENTANYL CITRATE (PF) 100 MCG/2ML IJ SOLN
INTRAMUSCULAR | Status: DC | PRN
  Administered 2017-07-19: 100 ug via INTRAVENOUS
  Administered 2017-07-19: 50 ug via INTRAVENOUS
  Administered 2017-07-19: 100 ug via INTRAVENOUS

## 2017-07-19 MED ORDER — ACETAMINOPHEN 325 MG PO TABS
325.0000 mg | ORAL_TABLET | Freq: Four times a day (QID) | ORAL | Status: DC | PRN
  Administered 2017-07-20: 18:00:00 650 mg via ORAL
  Filled 2017-07-19: qty 2

## 2017-07-19 MED ORDER — LIDOCAINE HCL (CARDIAC) 20 MG/ML IV SOLN
INTRAVENOUS | Status: DC | PRN
  Administered 2017-07-19: 40 mg via INTRAVENOUS

## 2017-07-19 MED ORDER — ONDANSETRON HCL 4 MG/2ML IJ SOLN
INTRAMUSCULAR | Status: DC | PRN
  Administered 2017-07-19: 4 mg via INTRAVENOUS

## 2017-07-19 MED ORDER — LACTATED RINGERS IV SOLN
INTRAVENOUS | Status: DC
  Administered 2017-07-19 (×2): via INTRAVENOUS

## 2017-07-19 MED ORDER — METHOCARBAMOL 500 MG PO TABS
500.0000 mg | ORAL_TABLET | Freq: Four times a day (QID) | ORAL | Status: DC | PRN
  Administered 2017-07-19 – 2017-07-22 (×7): 500 mg via ORAL
  Filled 2017-07-19 (×7): qty 1

## 2017-07-19 MED ORDER — CEFAZOLIN SODIUM-DEXTROSE 2-4 GM/100ML-% IV SOLN
2.0000 g | INTRAVENOUS | Status: AC
  Administered 2017-07-19: 2 g via INTRAVENOUS
  Filled 2017-07-19: qty 100

## 2017-07-19 MED ORDER — STERILE WATER FOR IRRIGATION IR SOLN
Status: DC | PRN
  Administered 2017-07-19: 2000 mL

## 2017-07-19 MED ORDER — GABAPENTIN 100 MG PO CAPS
100.0000 mg | ORAL_CAPSULE | Freq: Three times a day (TID) | ORAL | Status: DC
  Administered 2017-07-19 – 2017-07-22 (×9): 100 mg via ORAL
  Filled 2017-07-19 (×9): qty 1

## 2017-07-19 MED ORDER — HYDROMORPHONE HCL 1 MG/ML IJ SOLN
INTRAMUSCULAR | Status: AC
  Administered 2017-07-19: 0.5 mg via INTRAVENOUS
  Filled 2017-07-19: qty 2

## 2017-07-19 MED ORDER — LIDOCAINE 20MG/ML (2%) 15 ML SYRINGE OPTIME
INTRAMUSCULAR | Status: DC | PRN
  Administered 2017-07-19: 1 mg/kg/h via INTRAVENOUS

## 2017-07-19 MED ORDER — OXYCODONE HCL 5 MG PO TABS
10.0000 mg | ORAL_TABLET | ORAL | Status: DC | PRN
  Administered 2017-07-21 – 2017-07-22 (×2): 10 mg via ORAL
  Filled 2017-07-19 (×2): qty 2

## 2017-07-19 MED ORDER — ONDANSETRON HCL 4 MG/2ML IJ SOLN: 4.0000 mg | Freq: Four times a day (QID) | INTRAMUSCULAR | Status: DC | PRN

## 2017-07-19 MED ORDER — DOCUSATE SODIUM 100 MG PO CAPS
100.0000 mg | ORAL_CAPSULE | Freq: Two times a day (BID) | ORAL | Status: DC
  Administered 2017-07-19 – 2017-07-22 (×6): 100 mg via ORAL
  Filled 2017-07-19 (×6): qty 1

## 2017-07-19 MED ORDER — PANTOPRAZOLE SODIUM 40 MG PO TBEC
40.0000 mg | DELAYED_RELEASE_TABLET | Freq: Every day | ORAL | Status: DC
  Administered 2017-07-19 – 2017-07-22 (×4): 40 mg via ORAL
  Filled 2017-07-19 (×4): qty 1

## 2017-07-19 MED ORDER — METOCLOPRAMIDE HCL 5 MG/ML IJ SOLN: 5.0000 mg | Freq: Three times a day (TID) | INTRAMUSCULAR | Status: DC | PRN

## 2017-07-19 MED ORDER — DIPHENHYDRAMINE HCL 12.5 MG/5ML PO ELIX: 12.5000 mg | ORAL_SOLUTION | ORAL | Status: DC | PRN

## 2017-07-19 MED ORDER — CEFAZOLIN SODIUM-DEXTROSE 1-4 GM/50ML-% IV SOLN
1.0000 g | Freq: Four times a day (QID) | INTRAVENOUS | Status: AC
  Administered 2017-07-19 (×2): 1 g via INTRAVENOUS
  Filled 2017-07-19 (×2): qty 50

## 2017-07-19 SURGICAL SUPPLY — 42 items
BAG ZIPLOCK 12X15 (MISCELLANEOUS) IMPLANT
BENZOIN TINCTURE PRP APPL 2/3 (GAUZE/BANDAGES/DRESSINGS) IMPLANT
BLADE SAW SGTL 18X1.27X75 (BLADE) ×2 IMPLANT
BLADE SAW SGTL 18X1.27X75MM (BLADE) ×1
CAPT HIP TOTAL 2 ×3 IMPLANT
CLOSURE WOUND 1/2 X4 (GAUZE/BANDAGES/DRESSINGS)
COVER PERINEAL POST (MISCELLANEOUS) ×3 IMPLANT
COVER SURGICAL LIGHT HANDLE (MISCELLANEOUS) ×3 IMPLANT
DRAPE STERI IOBAN 125X83 (DRAPES) ×3 IMPLANT
DRAPE U-SHAPE 47X51 STRL (DRAPES) ×6 IMPLANT
DRSG AQUACEL AG ADV 3.5X10 (GAUZE/BANDAGES/DRESSINGS) ×3 IMPLANT
DURAPREP 26ML APPLICATOR (WOUND CARE) ×3 IMPLANT
ELECT REM PT RETURN 15FT ADLT (MISCELLANEOUS) ×3 IMPLANT
GAUZE XEROFORM 1X8 LF (GAUZE/BANDAGES/DRESSINGS) ×3 IMPLANT
GLOVE BIO SURGEON STRL SZ7.5 (GLOVE) ×3 IMPLANT
GLOVE BIOGEL PI IND STRL 7.0 (GLOVE) ×2 IMPLANT
GLOVE BIOGEL PI IND STRL 7.5 (GLOVE) ×2 IMPLANT
GLOVE BIOGEL PI IND STRL 8 (GLOVE) ×2 IMPLANT
GLOVE BIOGEL PI INDICATOR 7.0 (GLOVE) ×4
GLOVE BIOGEL PI INDICATOR 7.5 (GLOVE) ×4
GLOVE BIOGEL PI INDICATOR 8 (GLOVE) ×4
GLOVE ECLIPSE 7.0 STRL STRAW (GLOVE) ×6 IMPLANT
GLOVE ECLIPSE 8.0 STRL XLNG CF (GLOVE) ×3 IMPLANT
GOWN STRL REUS W/TWL LRG LVL3 (GOWN DISPOSABLE) ×6 IMPLANT
GOWN STRL REUS W/TWL XL LVL3 (GOWN DISPOSABLE) ×6 IMPLANT
HANDPIECE INTERPULSE COAX TIP (DISPOSABLE) ×2
HEAD CERAMIC DELTA 36 PLUS 1.5 (Hips) ×3 IMPLANT
HOLDER FOLEY CATH W/STRAP (MISCELLANEOUS) ×3 IMPLANT
PACK ANTERIOR HIP CUSTOM (KITS) ×3 IMPLANT
SET HNDPC FAN SPRY TIP SCT (DISPOSABLE) ×1 IMPLANT
SPONGE LAP 18X18 RF (DISPOSABLE) ×3 IMPLANT
STAPLER VISISTAT 35W (STAPLE) ×3 IMPLANT
STRIP CLOSURE SKIN 1/2X4 (GAUZE/BANDAGES/DRESSINGS) IMPLANT
SUT ETHIBOND NAB CT1 #1 30IN (SUTURE) ×3 IMPLANT
SUT MNCRL AB 4-0 PS2 18 (SUTURE) IMPLANT
SUT VIC AB 0 CT1 36 (SUTURE) ×3 IMPLANT
SUT VIC AB 1 CT1 36 (SUTURE) ×3 IMPLANT
SUT VIC AB 2-0 CT1 27 (SUTURE) ×4
SUT VIC AB 2-0 CT1 TAPERPNT 27 (SUTURE) ×2 IMPLANT
TRAY FOLEY CATH 14FRSI W/METER (CATHETERS) ×3 IMPLANT
TRAY FOLEY W/METER SILVER 16FR (SET/KITS/TRAYS/PACK) IMPLANT
YANKAUER SUCT BULB TIP 10FT TU (MISCELLANEOUS) ×3 IMPLANT

## 2017-07-19 NOTE — Anesthesia Procedure Notes (Signed)
Procedure Name: Intubation Date/Time: 07/19/2017 10:47 AM Performed by: Thornell MuleStubblefield, Angelis Gates G, CRNA Pre-anesthesia Checklist: Patient identified, Emergency Drugs available, Suction available and Patient being monitored Patient Re-evaluated:Patient Re-evaluated prior to induction Oxygen Delivery Method: Circle system utilized Preoxygenation: Pre-oxygenation with 100% oxygen Induction Type: IV induction Ventilation: Mask ventilation without difficulty Laryngoscope Size: Miller and 3 Grade View: Grade I Tube type: Oral Tube size: 7.0 mm Number of attempts: 1 Airway Equipment and Method: Stylet and Oral airway Placement Confirmation: ETT inserted through vocal cords under direct vision,  positive ETCO2 and breath sounds checked- equal and bilateral Secured at: 22 cm Tube secured with: Tape Dental Injury: Teeth and Oropharynx as per pre-operative assessment

## 2017-07-19 NOTE — Anesthesia Preprocedure Evaluation (Addendum)
Anesthesia Evaluation  Patient identified by MRN, date of birth, ID band Patient awake    Reviewed: Allergy & Precautions, NPO status , Patient's Chart, lab work & pertinent test results  Airway Mallampati: II  TM Distance: >3 FB Neck ROM: Full    Dental  (+) Dental Advisory Given   Pulmonary neg pulmonary ROS,    breath sounds clear to auscultation       Cardiovascular hypertension, Pt. on medications  Rhythm:Regular Rate:Normal     Neuro/Psych Anxiety Depression negative neurological ROS     GI/Hepatic negative GI ROS, Neg liver ROS,   Endo/Other  negative endocrine ROS  Renal/GU negative Renal ROS     Musculoskeletal  (+) Arthritis ,   Abdominal (+) + obese,   Peds  Hematology negative hematology ROS (+)   Anesthesia Other Findings   Reproductive/Obstetrics                            Lab Results  Component Value Date   WBC 3.7 (L) 07/12/2017   HGB 12.4 07/12/2017   HCT 38.8 07/12/2017   MCV 86.2 07/12/2017   PLT 214 07/12/2017   Lab Results  Component Value Date   CREATININE 0.80 07/12/2017   BUN 10 07/12/2017   NA 142 07/12/2017   K 3.7 07/12/2017   CL 108 07/12/2017   CO2 27 07/12/2017    Anesthesia Physical Anesthesia Plan  ASA: II  Anesthesia Plan: General   Post-op Pain Management:    Induction: Intravenous  PONV Risk Score and Plan: 3 and Midazolam, Dexamethasone, Ondansetron and Treatment may vary due to age or medical condition  Airway Management Planned: Oral ETT  Additional Equipment:   Intra-op Plan:   Post-operative Plan: Extubation in OR  Informed Consent: I have reviewed the patients History and Physical, chart, labs and discussed the procedure including the risks, benefits and alternatives for the proposed anesthesia with the patient or authorized representative who has indicated his/her understanding and acceptance.   Dental advisory  given  Plan Discussed with: CRNA  Anesthesia Plan Comments:        Anesthesia Quick Evaluation

## 2017-07-19 NOTE — Anesthesia Postprocedure Evaluation (Signed)
Anesthesia Post Note  Patient: Campbell LernerDeshane Garinger  Procedure(s) Performed: RIGHT TOTAL HIP ARTHROPLASTY ANTERIOR APPROACH (Right Hip)     Patient location during evaluation: PACU Anesthesia Type: General Level of consciousness: awake and alert Pain management: pain level controlled Vital Signs Assessment: post-procedure vital signs reviewed and stable Respiratory status: spontaneous breathing, nonlabored ventilation, respiratory function stable and patient connected to nasal cannula oxygen Cardiovascular status: blood pressure returned to baseline and stable Postop Assessment: no apparent nausea or vomiting Anesthetic complications: no    Last Vitals:  Vitals:   07/19/17 1400 07/19/17 1509  BP: 133/72 129/75  Pulse: (!) 57 (!) 55  Resp: 15 16  Temp: 37.3 C 37.1 C  SpO2: 100% 100%    Last Pain:  Vitals:   07/19/17 1509  TempSrc: Oral  PainSc:                  Kennieth RadFitzgerald, Myrah Strawderman E

## 2017-07-19 NOTE — Plan of Care (Signed)
  Progressing Education: Knowledge of General Education information will improve 07/19/2017 1550 - Progressing by Cordie Griceodriguez, Daymion Nazaire A, RN Health Behavior/Discharge Planning: Ability to manage health-related needs will improve 07/19/2017 1550 - Progressing by Cordie Griceodriguez, Brynlee Pennywell A, RN Clinical Measurements: Ability to maintain clinical measurements within normal limits will improve 07/19/2017 1550 - Progressing by Cordie Griceodriguez, Nayelli Inglis A, RN Will remain free from infection 07/19/2017 1550 - Progressing by Cordie Griceodriguez, Albion Weatherholtz A, RN Diagnostic test results will improve 07/19/2017 1550 - Progressing by Cordie Griceodriguez, Isa Hitz A, RN Respiratory complications will improve 07/19/2017 1550 - Progressing by Cordie Griceodriguez, Tersea Aulds A, RN Cardiovascular complication will be avoided 07/19/2017 1550 - Progressing by Cordie Griceodriguez, Jaedin Regina A, RN Activity: Risk for activity intolerance will decrease 07/19/2017 1550 - Progressing by Cordie Griceodriguez, Dabney Schanz A, RN Nutrition: Adequate nutrition will be maintained 07/19/2017 1550 - Progressing by Cordie Griceodriguez, Chase Knebel A, RN Coping: Level of anxiety will decrease 07/19/2017 1550 - Progressing by Cordie Griceodriguez, Harshitha Fretz A, RN Elimination: Will not experience complications related to bowel motility 07/19/2017 1550 - Progressing by Cordie Griceodriguez, Velvet Moomaw A, RN Will not experience complications related to urinary retention 07/19/2017 1550 - Progressing by Cordie Griceodriguez, Raziyah Vanvleck A, RN Pain Managment: General experience of comfort will improve 07/19/2017 1550 - Progressing by Cordie Griceodriguez, Tayloranne Lekas A, RN Safety: Ability to remain free from injury will improve 07/19/2017 1550 - Progressing by Cordie Griceodriguez, Lorianne Malbrough A, RN Skin Integrity: Risk for impaired skin integrity will decrease 07/19/2017 1550 - Progressing by Cordie Griceodriguez, Yitzhak Awan A, RN Education: Knowledge of the prescribed therapeutic regimen will improve 07/19/2017 1550 - Progressing by Cordie Griceodriguez, Ermel Verne A, RN Understanding of discharge needs will improve 07/19/2017 1550 - Progressing  by Cordie Griceodriguez, Aurelius Gildersleeve A, RN Activity: Ability to avoid complications of mobility impairment will improve 07/19/2017 1550 - Progressing by Cordie Griceodriguez, Arionne Iams A, RN Ability to tolerate increased activity will improve 07/19/2017 1550 - Progressing by Cordie Griceodriguez, Daaiel Starlin A, RN Clinical Measurements: Postoperative complications will be avoided or minimized 07/19/2017 1550 - Progressing by Cordie Griceodriguez, Abdulraheem Pineo A, RN Pain Management: Pain level will decrease with appropriate interventions 07/19/2017 1550 - Progressing by Cordie Griceodriguez, Jacinto Keil A, RN Skin Integrity: Signs of wound healing will improve 07/19/2017 1550 - Progressing by Cordie Griceodriguez, Dann Galicia A, RN

## 2017-07-19 NOTE — H&P (Signed)
TOTAL HIP ADMISSION H&P  Patient is admitted for right total hip arthroplasty.  Subjective:  Chief Complaint: right hip pain  HPI: Megan [name], 47 y.o. female, has a history of pain and functional disability in the right hip(s) due to arthritis and patient has failed non-surgical conservative treatments for greater than 12 weeks to include NSAID's and/or analgesics, corticosteriod injections, weight reduction as appropriate and activity modification.  Onset of symptoms was gradual starting 2 years ago with gradually worsening course since that time.The patient noted no past surgery on the right hip(s).  Patient currently rates pain in the right hip at 10 out of 10 with activity. Patient has night pain, worsening of pain with activity and weight bearing, pain that interfers with activities of daily living and pain with passive range of motion. Patient has evidence of subchondral sclerosis, periarticular osteophytes and joint space narrowing by imaging studies. This condition presents safety issues increasing the risk of falls.  There is no current active infection.  Patient Active Problem List   Diagnosis Date Noted  . Unilateral primary osteoarthritis, right hip 04/18/2017  . Pain of right hip joint 04/18/2017  . Chronic pain of right hip 07/20/2014  . Memory loss 07/20/2014  . Sciatica 01/25/2014  . Essential hypertension 01/25/2014  . Hair thinning 10/16/2013  . Depression 10/13/2013  . Anxiety state, unspecified 10/13/2013  . Leg cramps 10/13/2013  . Other screening mammogram 10/13/2013   Past Medical History:  Diagnosis Date  . Arthritis   . Complication of anesthesia    the last surgeries she has had patient states she woke up during surgery  . Depression Dx 2010  . Hypertension Dx 2015   last time meds taken - 2016   . OCD (obsessive compulsive disorder)   . PONV (postoperative nausea and vomiting)   . Tuberculosis    latent TB- 2000 - received treatment     Past  Surgical History:  Procedure Laterality Date  . COLONOSCOPY    . NO PAST SURGERIES    . TOOTH EXTRACTION      Current Facility-Administered Medications  Medication Dose Route Frequency Provider Last Rate Last Dose  . ceFAZolin (ANCEF) IVPB 2g/100 mL premix  2 g Intravenous On Call to OR Kirtland Bouchardlark, Gilbert W, PA-C      . chlorhexidine (HIBICLENS) 4 % liquid 4 application  60 mL Topical Once Kirtland Bouchardlark, Gilbert W, PA-C      . lactated ringers infusion   Intravenous Continuous Marcene DuosFitzgerald, Robert, MD 50 mL/hr at 07/19/17 [phone]    . tranexamic acid (CYKLOKAPRON) 1,000 mg in sodium chloride 0.9 % 100 mL IVPB  1,000 mg Intravenous To OR Green, Terri L, RPH       No Known Allergies  Social History   Tobacco Use  . Smoking status: Never Smoker  . Smokeless tobacco: Never Used  Substance Use Topics  . Alcohol use: No    Family History  Problem Relation Age of Onset  . Cancer Mother   . Cancer Father      Review of Systems  Musculoskeletal: Positive for joint pain.  All other systems reviewed and are negative.   Objective:  Physical Exam  Constitutional: She is oriented to person, place, and time. She appears well-developed and well-nourished.  HENT:  Head: Normocephalic and atraumatic.  Eyes: EOM are normal. Pupils are equal, round, and reactive to light.  Neck: Normal range of motion. Neck supple.  Cardiovascular: Normal rate.  Respiratory: Effort normal and breath sounds normal.  GI: Soft. Bowel sounds are normal.  Musculoskeletal:       Right hip: She exhibits decreased range of motion, decreased strength, tenderness and bony tenderness.  Neurological: She is alert and oriented to person, place, and time.  Skin: Skin is warm.  Psychiatric: She has a normal mood and affect.    Vital signs in last 24 hours: Temp:  [98.9 F (37.2 C)] 98.9 F (37.2 C) (03/15 0822) Pulse Rate:  [58] 58 (03/15 0822) Resp:  [16] 16 (03/15 0822) BP: (129)/(75) 129/75 (03/15 0822) SpO2:  [100 %] 100 %  (03/15 0822) Weight:  [240 lb (108.9 kg)] 240 lb (108.9 kg) (03/15 0848)  Labs:   Estimated body mass index is 36.49 kg/m as calculated from the following:   Height as of this encounter: 5\' 8"  (1.727 m).   Weight as of this encounter: 240 lb (108.9 kg).   Imaging Review Plain radiographs demonstrate severe degenerative joint disease of the right hip(s). The bone quality appears to be excellent for age and reported activity level.  Assessment/Plan:  End stage arthritis, right hip(s)  The patient history, physical examination, clinical judgement of the provider and imaging studies are consistent with end stage degenerative joint disease of the right hip(s) and total hip arthroplasty is deemed medically necessary. The treatment options including medical management, injection therapy, arthroscopy and arthroplasty were discussed at length. The risks and benefits of total hip arthroplasty were presented and reviewed. The risks due to aseptic loosening, infection, stiffness, dislocation/subluxation,  thromboembolic complications and other imponderables were discussed.  The patient acknowledged the explanation, agreed to proceed with the plan and consent was signed. Patient is being admitted for inpatient treatment for surgery, pain control, PT, OT, prophylactic antibiotics, VTE prophylaxis, progressive ambulation and ADL's and discharge planning.The patient is planning to be discharged home with home health services

## 2017-07-19 NOTE — Transfer of Care (Signed)
Immediate Anesthesia Transfer of Care Note  Patient: Megan LernerDeshane [name]  Procedure(s) Performed: RIGHT TOTAL HIP ARTHROPLASTY ANTERIOR APPROACH (Right Hip)  Patient Location: PACU  Anesthesia Type:General  Level of Consciousness: drowsy, patient cooperative and responds to stimulation  Airway & Oxygen Therapy: Patient Spontanous Breathing and Patient connected to face mask oxygen  Post-op Assessment: Report given to RN and Post -op Vital signs reviewed and stable  Post vital signs: Reviewed and stable  Last Vitals:  Vitals:   07/19/17 0822  BP: 129/75  Pulse: (!) 58  Resp: 16  Temp: 37.2 C  SpO2: 100%    Last Pain:  Vitals:   07/19/17 0848  TempSrc:   PainSc: 3          Complications: No apparent anesthesia complications

## 2017-07-19 NOTE — Brief Op Note (Signed)
07/19/2017  12:18 PM  PATIENT:  Campbell Lernereshane Taras  47 y.o. female  PRE-OPERATIVE DIAGNOSIS:  osteoarthritis right hip  POST-OPERATIVE DIAGNOSIS:  osteoarthritis right hip  PROCEDURE:  Procedure(s): RIGHT TOTAL HIP ARTHROPLASTY ANTERIOR APPROACH (Right)  SURGEON:  Surgeon(s) and Role:    Kathryne Hitch* Alem Fahl Y, MD - Primary  PHYSICIAN ASSISTANT: Rexene EdisonGil Clark, PA-C  ANESTHESIA:   general  EBL:  300 mL   COUNTS:  YES  DICTATION: .Other Dictation: Dictation Number [id number]  PLAN OF CARE: Admit to inpatient   PATIENT DISPOSITION:  PACU - hemodynamically stable.   Delay start of Pharmacological VTE agent (>24hrs) due to surgical blood loss or risk of bleeding: no

## 2017-07-20 LAB — BASIC METABOLIC PANEL
ANION GAP: 6 (ref 5–15)
BUN: 8 mg/dL (ref 6–20)
CHLORIDE: 107 mmol/L (ref 101–111)
CO2: 25 mmol/L (ref 22–32)
Calcium: 8.2 mg/dL — ABNORMAL LOW (ref 8.9–10.3)
Creatinine, Ser: 0.82 mg/dL (ref 0.44–1.00)
GFR calc Af Amer: >60 mL/min (ref >60)
GLUCOSE: 132 mg/dL — AB (ref 65–99)
POTASSIUM: 4.1 mmol/L (ref 3.5–5.1)
Sodium: 138 mmol/L (ref 135–145)

## 2017-07-20 LAB — CBC
HEMATOCRIT: 33.3 % — AB (ref 36.0–46.0)
HEMOGLOBIN: 10.6 g/dL — AB (ref 12.0–15.0)
MCH: 27.2 pg (ref 26.0–34.0)
MCHC: 31.8 g/dL (ref 30.0–36.0)
MCV: 85.4 fL (ref 78.0–100.0)
Platelets: 234 10*3/uL (ref 150–400)
RBC: 3.9 MIL/uL (ref 3.87–5.11)
RDW: 12.5 % (ref 11.5–15.5)
WBC: 9 10*3/uL (ref 4.0–10.5)

## 2017-07-20 NOTE — Progress Notes (Signed)
Subjective: 1 Day Post-Op Procedure(s) (LRB): RIGHT TOTAL HIP ARTHROPLASTY ANTERIOR APPROACH (Right) Patient reports pain as moderate.  No complaints.   Objective: Vital signs in last 24 hours: Temp:  [98 F (36.7 C)-99.2 F (37.3 C)] 98.4 F (36.9 C) (03/16 0445) Pulse Rate:  [55-85] 61 (03/16 0445) Resp:  [14-20] 20 (03/16 0100) BP: (104-145)/(60-87) 104/60 (03/16 0445) SpO2:  [100 %] 100 % (03/16 0445) Weight:  [240 lb (108.9 kg)] 240 lb (108.9 kg) (03/15 0848)  Intake/Output from previous day: 03/15 0701 - 03/16 0700 In: 3596.3 [P.O.:300; I.V.:3031.3; IV Piggyback:265] Out: 2130 [Urine:1830; Blood:300] Intake/Output this shift: No intake/output data recorded.  Recent Labs    07/20/17 0601  HGB 10.6*   Recent Labs    07/20/17 0601  WBC 9.0  RBC 3.90  HCT 33.3*  PLT 234   Recent Labs    07/20/17 0601  NA 138  K 4.1  CL 107  CO2 25  BUN 8  CREATININE 0.82  GLUCOSE 132*  CALCIUM 8.2*   No results for input(s): LABPT, INR in the last 72 hours.   Right lower extremity: Sensation intact distally Dorsiflexion/Plantar flexion intact Incision: dressing C/D/I Compartment soft  Assessment/Plan: 1 Day Post-Op Procedure(s) (LRB): RIGHT TOTAL HIP ARTHROPLASTY ANTERIOR APPROACH (Right) Up with therapy Monitor for any symptoms of anemia Megan [name] 07/20/2017, 7:25 AM

## 2017-07-20 NOTE — Op Note (Signed)
NAME:  Megan [name], Megan [name]                   ACCOUNT NO.:  MEDICAL RECORD NO.:  [id number]  LOCATION:                                 FACILITY:  PHYSICIAN:  Vanita Panda. Magnus Ivan, M.D.DATE OF BIRTH:  DATE OF PROCEDURE:  07/19/2017 DATE OF DISCHARGE:                              OPERATIVE REPORT   PREOPERATIVE DIAGNOSIS:  Primary osteoarthritis and degenerative joint disease, right hip.  POSTOPERATIVE DIAGNOSIS:  Primary osteoarthritis and degenerative joint disease, right hip.  PROCEDURE:  Right total knee arthroplasty through a direct anterior approach.  IMPLANTS:  DePuy Sector Gription acetabular component size 54, size 36 +0 neutral polyethylene liner for a size 54 acetabular component.  Size 36 -2 metal hip ball, size 10 Corail femoral component with varus offset, a single screw in the acetabulum.  SURGEON:  Vanita Panda. Magnus Ivan, M.D.  ASSISTANT:  Richardean Canal, PA-C.  ANESTHESIA:  General.  ANTIBIOTICS:  2 g of IV Ancef.  BLOOD LOSS:  350 cc.  COMPLICATIONS:  None.  INDICATIONS:  Megan [name] is a 47 year old female, who has a history of debilitating arthritis involving her right hip.  Her pain is daily and it has detrimentally affected her activities of daily living, her quality of life and her mobility.  Her x-rays do show significant joint space narrowing of her right hip.  Her pain is daily and it is 10/10. She has tried an intra-articular injection that did help temporize things for a while, but her pain came back.  At this point, she does wish to proceed with a total hip arthroplasty through direct anterior approach.  She understands the risk of acute blood loss anemia, nerve and vessel injury, fracture, infection, dislocation, DVT.  She understands our goals are to decrease pain, improve mobility and overall improved quality of life.  PROCEDURE DESCRIPTION:  After informed consent was obtained, appropriate right hip was marked.  She was brought to the  operating room, she was kept supine on a stretcher, and general anesthesia was obtained while she was on her stretcher.  A Foley catheter was placed and traction boots were placed on both of her feet.  Next, she was placed supine on the Hana fracture table with the perineal post in place and both legs in inline skeletal traction devices, but no traction applied.  Her right operative hip was prepped and draped with DuraPrep and sterile drapes. A time-out was called, she was identified as correct patient and correct right hip.  We then made an incision just inferior and posterior to the anterior superior iliac spine and carried this obliquely down the leg. We dissected down the tensor fascia lata muscle.  The tensor fascia was then divided longitudinally to proceed with a direct anterior approach to the hip.  We identified and cauterized the circumflex vessels, and then identified the hip capsule.  I opened up the hip capsule in an L- type format, finding a very large joint effusion.  We did find significant arthritis about her right hip.  We then made our femoral neck cut with an oscillating saw just proximal to the lesser trochanter and completed this with an osteotome.  I placed a corkscrew guide  in the femoral head and removed the femoral head in its entirety and then found a large area of devoid of cartilage.  I then placed a bent Hohmann over the medial acetabular rim and removed remnants of the acetabular labrum and other debris.  We then began reaming under direct visualization from a size 43 reamer going in stepwise increments up to a size 53 with all reamers under direct visualization and the last reamer under direct fluoroscopy, so we could obtain our depth of reaming, our inclination and anteversion.  We then placed the real DePuy Sector Gription acetabular component size 54 and single screw.  We placed a 36 +0 liner for that size acetabular component.  Attention was then turned  to the femur.  With the leg externally rotated to 120 degrees, extended and adducted, we were able to place a Mueller retractor medially and a Hohmann retractor behind the greater trochanter.  We released the lateral joint capsule and used a box cutting osteotome to enter the femoral canal and a rongeur to lateralize.  We then began broaching using the Corail broaching system from a size 8 going only up to a size 10 due to her thick bone and her narrow canal.  With the 10 in place, we trialed a 36 +1.5 hip ball and reduced this in the acetabulum.  We felt like she was just a little bit long.  We dislocated the hip and removed the trial components.  We were able to place the real Corail femoral component with varus offset, size 10 and then a 36 -2 metal hip ball. We reduced this in the acetabulum and we were pleased with leg length, offset, range of motion and stability.  We then irrigated the soft tissue with normal saline solution using pulsatile lavage.  We closed the joint capsule with interrupted #1 Ethibond suture followed by running #1 Vicryl in the tensor fascia, 0 Vicryl in the deep tissue, 2-0 Vicryl in the subcutaneous tissue, interrupted staples on the skin. Xeroform and well-padded sterile dressing were applied.  She was taken off the operating table, awakened, extubated and taken to the recovery room in stable condition.  All final counts were correct.  There were no complications noted.  Of note, Richardean CanalGilbert Clark, PA-C, assisted in the entire case.  His assistance was crucial for facilitating all aspects of this case.     Vanita Pandahristopher Y. Magnus IvanBlackman, M.D.     CYB/MEDQ  D:  07/19/2017  T:  07/20/2017  Job:  [id number]

## 2017-07-20 NOTE — Evaluation (Signed)
Occupational Therapy Evaluation and Discharge Patient Details Name: Campbell LernerDeshane Laraia MRN: [id number] DOB: 1970/06/25 Today's Date: 07/20/2017    History of Present Illness Pt is a 47 y.o. female s/p R THA.   Clinical Impression   This 47 yo female admitted and underwent above presents to acute OT with all education completed and no further OT needs, we will sign off.    Follow Up Recommendations  No OT follow up;Supervision/Assistance - 24 hour    Equipment Recommendations  3 in 1 bedside commode       Precautions / Restrictions Precautions Precautions: None Restrictions Weight Bearing Restrictions: No RLE Weight Bearing: Weight bearing as tolerated             ADL either performed or assessed with clinical judgement   ADL                                         General ADL Comments: Educated and demonstrated to pt on tub transfer with 3n1 and gave pt handout; as well as side step into tub if she does not use 3n1 (written on handout), sequence of dressing for efficiency (reports he family will A prn until she can do it by herself). Pt has been getting up to 3n1 in bathroom wiyhout issue per her report     Vision Patient Visual Report: No change from baseline              Pertinent Vitals/Pain Pain Assessment: 0-10 Pain Score: 7  Pain Location: R hip Pain Descriptors / Indicators: Aching;Sore Pain Intervention(s): Monitored during session     Hand Dominance Right   Extremity/Trunk Assessment Upper Extremity Assessment Upper Extremity Assessment: Overall WFL for tasks assessed           Communication Communication Communication: No difficulties   Cognition Arousal/Alertness: Awake/alert Behavior During Therapy: WFL for tasks assessed/performed Overall Cognitive Status: Within Functional Limits for tasks assessed                                                Home Living Family/patient expects to be discharged to::  Private residence Living Arrangements: Children Available Help at Discharge: Family;Available 24 hours/day Type of Home: Apartment Home Access: Stairs to enter Entrance Stairs-Number of Steps: 1   Home Layout: Two level;Bed/bath upstairs Alternate Level Stairs-Number of Steps: flight Alternate Level Stairs-Rails: Right Bathroom Shower/Tub: Tub/shower unit;Curtain   Bathroom Toilet: Standard Bathroom Accessibility: Yes   Home Equipment: None          Prior Functioning/Environment Level of Independence: Independent                 OT Problem List: Decreased range of motion;Impaired balance (sitting and/or standing);Pain         OT Goals(Current goals can be found in the care plan section) Acute Rehab OT Goals Patient Stated Goal: to make sure she gets PT at home  OT Frequency:                AM-PAC PT "6 Clicks" Daily Activity     Outcome Measure Help from another person eating meals?: None Help from another person taking care of personal grooming?: A Little Help from another person toileting, which includes using toliet, bedpan, or urinal?: A  Little Help from another person bathing (including washing, rinsing, drying)?: A Little Help from another person to put on and taking off regular upper body clothing?: A Little Help from another person to put on and taking off regular lower body clothing?: A Lot 6 Click Score: 18   End of Session    Activity Tolerance: Patient tolerated treatment well Patient left: in bed;with call bell/phone within reach;with family/visitor present  OT Visit Diagnosis: Pain Pain - Right/Left: Right Pain - part of body: Hip                Time: 1610-9604 OT Time Calculation (min): 19 min Charges:  OT General Charges $OT Visit: 1 Visit OT Evaluation $OT Eval Moderate Complexity: [address] [place] Pal[place], [place] [phone] 07/20/2017

## 2017-07-20 NOTE — Progress Notes (Signed)
Physical Therapy Treatment Patient Details Name: Megan LernerDeshane [name] MRN: [id number] DOB: May 10, 1970 Today's Date: 07/20/2017    History of Present Illness Pt is a 47 y.o. female s/p R THA.    PT Comments    POD # 1 pm session.  Assisted OOB to amb to and from bathroom then back to bed and performed some THR TE's followed by ICE.   Follow Up Recommendations  Follow surgeon's recommendation for DC plan and follow-up therapies     Equipment Recommendations  Rolling walker with 5" wheels    Recommendations for Other Services       Precautions / Restrictions Precautions Precautions: None Restrictions Weight Bearing Restrictions: Yes RLE Weight Bearing: Weight bearing as tolerated    Mobility  Bed Mobility Overal bed mobility: Needs Assistance Bed Mobility: Supine to Sit;Sit to Supine     Supine to sit: Min assist Sit to supine: Mod assist   General bed mobility comments: pt presfers to move both legs together.  Assisted OOB and back to bed.   Transfers Overall transfer level: Needs assistance Equipment used: Rolling walker (2 wheeled) Transfers: Sit to/from UGI CorporationStand;Stand Pivot Transfers Sit to Stand: Min assist;+2 safety/equipment Stand pivot transfers: +2 safety/equipment;Min assist       General transfer comment: verbal cues for hand placement, assist to power up, increased time to complete   also assisted to bathroom on and off toilet  Ambulation/Gait Ambulation/Gait assistance: Min guard Ambulation Distance (Feet): 20 Feet(10 feet x 2 to and from bathroom) Assistive device: Rolling walker (2 wheeled) Gait Pattern/deviations: Step-to pattern;Antalgic Gait velocity: decreased   General Gait Details: verbal cues for sequencing and posture plus increased time   Stairs            Wheelchair Mobility    Modified Rankin (Stroke Patients Only)       Balance                                            Cognition Arousal/Alertness:  Awake/alert Behavior During Therapy: WFL for tasks assessed/performed Overall Cognitive Status: Within Functional Limits for tasks assessed                                        Exercises  10 reps AP, knee presses, glut squeezes and AAROM HS    General Comments        Pertinent Vitals/Pain Pain Assessment: 0-10 Pain Score: 8  Pain Location: R hip Pain Descriptors / Indicators: Aching;Grimacing;Guarding;Operative site guarding Pain Intervention(s): Monitored during session;Premedicated before session;Repositioned;Ice applied    Home Living                      Prior Function            PT Goals (current goals can now be found in the care plan section) Progress towards PT goals: Progressing toward goals    Frequency    7X/week      PT Plan Current plan remains appropriate    Co-evaluation              AM-PAC PT "6 Clicks" Daily Activity  Outcome Measure  Difficulty turning over in bed (including adjusting bedclothes, sheets and blankets)?: A Lot Difficulty moving from lying on back to sitting on the side of  the bed? : A Lot Difficulty sitting down on and standing up from a chair with arms (e.g., wheelchair, bedside commode, etc,.)?: A Little Help needed moving to and from a bed to chair (including a wheelchair)?: A Little Help needed walking in hospital room?: A Little Help needed climbing 3-5 steps with a railing? : A Lot 6 Click Score: 15    End of Session Equipment Utilized During Treatment: Gait belt Activity Tolerance: Patient tolerated treatment well Patient left: in bed;with call bell/phone within reach;with family/visitor present Nurse Communication: Mobility status PT Visit Diagnosis: Difficulty in walking, not elsewhere classified (R26.2);Pain;Other abnormalities of gait and mobility (R26.89) Pain - Right/Left: Right Pain - part of body: Hip     Time: 1322-1340 PT Time Calculation (min) (ACUTE ONLY): 18  min  Charges:  $Gait Training: 8-22 mins                    G Codes:       {Megan [name]  PTA [facility]  Acute  Rehab Pager      [phone]

## 2017-07-20 NOTE — Progress Notes (Signed)
   Subjective: 1 Day Post-Op Procedure(s) (LRB): RIGHT TOTAL HIP ARTHROPLASTY ANTERIOR APPROACH (Right) Patient reports pain as moderate.    Objective: Vital signs in last 24 hours: Temp:  [98 F (36.7 C)-99.2 F (37.3 C)] 98.5 F (36.9 C) (03/16 0800) Pulse Rate:  [55-85] 62 (03/16 0800) Resp:  [14-20] 18 (03/16 0800) BP: (104-145)/(60-87) 110/73 (03/16 0800) SpO2:  [100 %] 100 % (03/16 0800)  Intake/Output from previous day: 03/15 0701 - 03/16 0700 In: 3596.3 [P.O.:300; I.V.:3031.3; IV Piggyback:265] Out: 2130 [Urine:1830; Blood:300] Intake/Output this shift: No intake/output data recorded.  Recent Labs    07/20/17 0601  HGB 10.6*   Recent Labs    07/20/17 0601  WBC 9.0  RBC 3.90  HCT 33.3*  PLT 234   Recent Labs    07/20/17 0601  NA 138  K 4.1  CL 107  CO2 25  BUN 8  CREATININE 0.82  GLUCOSE 132*  CALCIUM 8.2*   No results for input(s): LABPT, INR in the last 72 hours.  Neurologically intact. Leg length equal, incisions.  Dg Pelvis Portable  Result Date: 07/19/2017 CLINICAL DATA:  Status post right total hip replacement. EXAM: PORTABLE PELVIS 1-2 VIEWS COMPARISON:  Fluoroscopic images of same day. Radiographs of April 03, 2017. FINDINGS: The femoral and acetabular components appear to be well situated. No fracture or dislocation is noted. Expected postoperative changes are noted in the surrounding soft tissues. IMPRESSION: Status post right total hip arthroplasty. Electronically Signed   By: Lupita RaiderJames  Green Jr, M.D.   On: 07/19/2017 13:17   Dg C-arm 1-60 Min-no Report  Result Date: 07/19/2017 Fluoroscopy was utilized by the requesting physician.  No radiographic interpretation.   Dg Hip Operative Unilat W Or W/o Pelvis Right  Result Date: 07/19/2017 CLINICAL DATA:  Right total hip arthroplasty. EXAM: OPERATIVE RIGHT HIP (WITH PELVIS IF PERFORMED) TECHNIQUE: Fluoroscopic spot image(s) were submitted for interpretation post-operatively. COMPARISON:   Right hip x-rays dated April 03, 2017. FINDINGS: Intraoperative x-rays demonstrate interval right total hip arthroplasty. Components are well aligned. No acute fracture. IMPRESSION: Right total hip arthroplasty.  No acute abnormality. FLUOROSCOPY TIME:  40 seconds. Electronically Signed   By: Obie DredgeWilliam T Derry M.D.   On: 07/19/2017 12:22    Assessment/Plan: 1 Day Post-Op Procedure(s) (LRB): RIGHT TOTAL HIP ARTHROPLASTY ANTERIOR APPROACH (Right) Plan:  Up with therapy.   Eldred MangesMark C Taft Worthing 07/20/2017, 9:55 AM

## 2017-07-20 NOTE — Evaluation (Signed)
Physical Therapy Evaluation Patient Details Name: Megan [name] MRN: [id number] DOB: [birth year] Today's Date: 07/20/2017   History of Present Illness  Pt is a 47 y.o. female s/p R THA.  Clinical Impression  Pt is s/p THA resulting in the deficits listed below (see PT Problem List). PTA pt independent with all functional mobility. On eval, she required min assist bed mobility, min assist transfers, and min guard assist ambulation 100 feet with RW. Increased time required to complete all mobility. Pt will benefit from skilled PT to increase their independence and safety with mobility to allow discharge to the venue listed below.      Follow Up Recommendations Follow surgeon's recommendation for DC plan and follow-up therapies    Equipment Recommendations  Rolling walker with 5" wheels    Recommendations for Other Services       Precautions / Restrictions Precautions Precautions: None Restrictions Weight Bearing Restrictions: Yes RLE Weight Bearing: Weight bearing as tolerated      Mobility  Bed Mobility Overal bed mobility: Needs Assistance Bed Mobility: Supine to Sit     Supine to sit: Min assist;HOB elevated     General bed mobility comments: +rail, increased time and effort to complete, assist with RLE, verbal cues for sequencing  Transfers Overall transfer level: Needs assistance Equipment used: Rolling walker (2 wheeled) Transfers: Sit to/from Stand Sit to Stand: Min assist         General transfer comment: verbal cues for hand placement, assist to power up, increased time to complete  Ambulation/Gait Ambulation/Gait assistance: Min guard Ambulation Distance (Feet): 100 Feet Assistive device: Rolling walker (2 wheeled) Gait Pattern/deviations: Step-to pattern;Antalgic Gait velocity: decreased Gait velocity interpretation: Below normal speed for age/gender General Gait Details: verbal cues for sequencing and posture  Stairs            Wheelchair  Mobility    Modified Rankin (Stroke Patients Only)       Balance Overall balance assessment: Needs assistance Sitting-balance support: No upper extremity supported;Feet supported Sitting balance-Leahy Scale: Good     Standing balance support: Bilateral upper extremity supported;During functional activity Standing balance-Leahy Scale: Fair Standing balance comment: RW for ambulation                             Pertinent Vitals/Pain Pain Assessment: 0-10 Pain Score: 6  Pain Location: R hip Pain Descriptors / Indicators: Aching;Grimacing;Guarding Pain Intervention(s): Monitored during session;Ice applied;Repositioned    Home Living Family/patient expects to be discharged to:: Private residence Living Arrangements: Children Available Help at Discharge: Family;Available 24 hours/day Type of Home: Apartment Home Access: Stairs to enter   Entrance Stairs-Number of Steps: 1 Home Layout: Two level;Bed/bath upstairs Home Equipment: None      Prior Function Level of Independence: Independent               Hand Dominance        Extremity/Trunk Assessment   Upper Extremity Assessment Upper Extremity Assessment: Overall WFL for tasks assessed    Lower Extremity Assessment Lower Extremity Assessment: RLE deficits/detail RLE Deficits / Details: expected deficits from hip surgery RLE: Unable to fully assess due to pain    Cervical / Trunk Assessment Cervical / Trunk Assessment: Normal  Communication   Communication: No difficulties  Cognition Arousal/Alertness: Awake/alert Behavior During Therapy: WFL for tasks assessed/performed Overall Cognitive Status: Within Functional Limits for tasks assessed  General Comments      Exercises     Assessment/Plan    PT Assessment Patient needs continued PT services  PT Problem List Decreased strength;Decreased mobility;Decreased range of  motion;Decreased activity tolerance;Decreased balance;Decreased knowledge of use of DME;Pain       PT Treatment Interventions DME instruction;Therapeutic activities;Gait training;Therapeutic exercise;Patient/family education;Stair training;Balance training;Functional mobility training    PT Goals (Current goals can be found in the Care Plan section)  Acute Rehab PT Goals Patient Stated Goal: decrease pain PT Goal Formulation: With patient Time For Goal Achievement: 08/03/17 Potential to Achieve Goals: Good    Frequency 7X/week   Barriers to discharge        Co-evaluation               AM-PAC PT "6 Clicks" Daily Activity  Outcome Measure Difficulty turning over in bed (including adjusting bedclothes, sheets and blankets)?: Unable Difficulty moving from lying on back to sitting on the side of the bed? : Unable Difficulty sitting down on and standing up from a chair with arms (e.g., wheelchair, bedside commode, etc,.)?: Unable Help needed moving to and from a bed to chair (including a wheelchair)?: A Little Help needed walking in hospital room?: A Little Help needed climbing 3-5 steps with a railing? : A Lot 6 Click Score: 11    End of Session Equipment Utilized During Treatment: Gait belt Activity Tolerance: Patient tolerated treatment well Patient left: in chair;with call bell/phone within reach;with nursing/sitter in room Nurse Communication: Mobility status PT Visit Diagnosis: Difficulty in walking, not elsewhere classified (R26.2);Pain;Other abnormalities of gait and mobility (R26.89) Pain - Right/Left: Right Pain - part of body: Hip    Time: 9604-5409 PT Time Calculation (min) (ACUTE ONLY): 33 min   Charges:   PT Evaluation $PT Eval Moderate Complexity: 1 Mod PT Treatments $Gait Training: 8-22 mins   PT G Codes:        Aida Raider, PT  Office # [phone] Pager [phone]   Ilda Foil 07/20/2017, 9:43 AM

## 2017-07-20 NOTE — Progress Notes (Signed)
OT Cancellation Note  Patient Details Name: Megan LernerDeshane [name] MRN: [id number] DOB: Aug 20, 1970   Cancelled Treatment:    Reason Eval/Treat Not Completed: Other (comment). Pt reports she just recently finished with PT and pain is a 7, wants to wait until later for OT.  Evette GeorgesLeonard, Xzavion Doswell Eva [phone] 07/20/2017, 11:18 AM

## 2017-07-21 LAB — CBC
HEMATOCRIT: 32.3 % — AB (ref 36.0–46.0)
Hemoglobin: 10.3 g/dL — ABNORMAL LOW (ref 12.0–15.0)
MCH: 27.2 pg (ref 26.0–34.0)
MCHC: 31.9 g/dL (ref 30.0–36.0)
MCV: 85.2 fL (ref 78.0–100.0)
PLATELETS: 198 10*3/uL (ref 150–400)
RBC: 3.79 MIL/uL — AB (ref 3.87–5.11)
RDW: 12.6 % (ref 11.5–15.5)
WBC: 6.3 10*3/uL (ref 4.0–10.5)

## 2017-07-21 MED ORDER — ASPIRIN 325 MG PO TBEC: 325.0000 mg | DELAYED_RELEASE_TABLET | Freq: Two times a day (BID) | ORAL | 0 refills | Status: DC

## 2017-07-21 MED ORDER — OXYCODONE HCL 5 MG PO TABS: 5.0000 mg | ORAL_TABLET | ORAL | 0 refills | Status: DC | PRN

## 2017-07-21 MED ORDER — METHOCARBAMOL 500 MG PO TABS: 500.0000 mg | ORAL_TABLET | Freq: Four times a day (QID) | ORAL | 1 refills | Status: DC | PRN

## 2017-07-21 NOTE — Discharge Instructions (Signed)

## 2017-07-21 NOTE — Progress Notes (Signed)
Physical Therapy Treatment Patient Details Name: Megan LernerDeshane [name] MRN: [id number] DOB: 12/07/1970 Today's Date: 07/21/2017    History of Present Illness Pt is a 47 y.o. female s/p R THA.    PT Comments    Progressing with mobility.    Follow Up Recommendations  Follow surgeon's recommendation for DC plan and follow-up therapies     Equipment Recommendations  Rolling walker with 5" wheels    Recommendations for Other Services       Precautions / Restrictions Precautions Precautions: None Restrictions Weight Bearing Restrictions: No RLE Weight Bearing: Weight bearing as tolerated    Mobility  Bed Mobility Overal bed mobility: Needs Assistance Bed Mobility: Supine to Sit     Supine to sit: Min assist;HOB elevated     General bed mobility comments: Assist for R LE. Increased time.   Transfers Overall transfer level: Needs assistance Equipment used: Rolling walker (2 wheeled) Transfers: Sit to/from Stand Sit to Stand: Min guard;From elevated surface         General transfer comment: Close guard for safety. VCs hand/LE placement. Increased time.   Ambulation/Gait Ambulation/Gait assistance: Min guard Ambulation Distance (Feet): 65 Feet Assistive device: Rolling walker (2 wheeled) Gait Pattern/deviations: Step-to pattern     General Gait Details: slow gait speed. close guard for safety. VCs sequence.    Stairs            Wheelchair Mobility    Modified Rankin (Stroke Patients Only)       Balance                                            Cognition Arousal/Alertness: Awake/alert Behavior During Therapy: WFL for tasks assessed/performed Overall Cognitive Status: Within Functional Limits for tasks assessed                                        Exercises Total Joint Exercises Ankle Circles/Pumps: AROM;Both;10 reps;Supine Quad Sets: AROM;Both;10 reps;Supine Heel Slides: AAROM;Right;10 reps;Supine Hip  ABduction/ADduction: AAROM;Right;10 reps;Supine    General Comments        Pertinent Vitals/Pain Pain Assessment: 0-10 Pain Location: R hip Pain Descriptors / Indicators: Aching;Sore Pain Intervention(s): Monitored during session    Home Living                      Prior Function            PT Goals (current goals can now be found in the care plan section) Progress towards PT goals: Progressing toward goals    Frequency    7X/week      PT Plan Current plan remains appropriate    Co-evaluation              AM-PAC PT "6 Clicks" Daily Activity  Outcome Measure  Difficulty turning over in bed (including adjusting bedclothes, sheets and blankets)?: A Lot Difficulty moving from lying on back to sitting on the side of the bed? : Unable Difficulty sitting down on and standing up from a chair with arms (e.g., wheelchair, bedside commode, etc,.)?: A Little Help needed moving to and from a bed to chair (including a wheelchair)?: A Little Help needed walking in hospital room?: A Little Help needed climbing 3-5 steps with a railing? : A Lot 6 Click Score:  14    End of Session Equipment Utilized During Treatment: Gait belt Activity Tolerance: Patient tolerated treatment well Patient left: in chair;with call bell/phone within reach   PT Visit Diagnosis: Difficulty in walking, not elsewhere classified (R26.2);Pain;Other abnormalities of gait and mobility (R26.89) Pain - Right/Left: Left Pain - part of body: Hip     Time: 4098-1191 PT Time Calculation (min) (ACUTE ONLY): 19 min  Charges:  $Gait Training: 8-22 mins                    G Codes:          Rebeca Alert, MPT Pager: [phone]

## 2017-07-21 NOTE — Progress Notes (Signed)
Subjective: 2 Days Post-Op Procedure(s) (LRB): RIGHT TOTAL HIP ARTHROPLASTY ANTERIOR APPROACH (Right) Patient reports pain as mild.  No complaints.   Objective: Vital signs in last 24 hours: Temp:  [98.5 F (36.9 C)-98.8 F (37.1 C)] 98.5 F (36.9 C) (03/17 0317) Pulse Rate:  [64-83] 83 (03/17 0317) Resp:  [15-16] 16 (03/17 0317) BP: (115-126)/(53-66) 125/66 (03/17 0317) SpO2:  [100 %] 100 % (03/17 0317)  Intake/Output from previous day: 03/16 0701 - 03/17 0700 In: 1160 [P.O.:410; I.V.:750] Out: 800 [Urine:800] Intake/Output this shift: Total I/O In: 120 [P.O.:120] Out: -   Recent Labs    07/20/17 0601 07/21/17 0532  HGB 10.6* 10.3*   Recent Labs    07/20/17 0601 07/21/17 0532  WBC 9.0 6.3  RBC 3.90 3.79*  HCT 33.3* 32.3*  PLT 234 198   Recent Labs    07/20/17 0601  NA 138  K 4.1  CL 107  CO2 25  BUN 8  CREATININE 0.82  GLUCOSE 132*  CALCIUM 8.2*   No results for input(s): LABPT, INR in the last 72 hours.  Right lower Extremity: Intact pulses distally Dorsiflexion/Plantar flexion intact Incision: dressing C/D/I Compartment soft  Assessment/Plan: 2 Days Post-Op Procedure(s) (LRB): RIGHT TOTAL HIP ARTHROPLASTY ANTERIOR APPROACH (Right) Up with therapy  Plan discharge in AM   GILBERT CLARK 07/21/2017, 10:22 AM

## 2017-07-21 NOTE — Progress Notes (Signed)
Physical Therapy Treatment Patient Details Name: Megan [name] MRN: [id number] DOB: 09/21/1970 Today's Date: 07/21/2017    History of Present Illness Pt is a 48 y.o. female s/p R THA.    PT Comments    Progressing slowly with mobility. Practiced 1 step to simulate entry into home. Will plan to practice stair negotiation on tomorrow to simulate getting up to bedroom.    Follow Up Recommendations  Follow surgeon's recommendation for DC plan and follow-up therapies     Equipment Recommendations  Rolling walker with 5" wheels    Recommendations for Other Services       Precautions / Restrictions Precautions Precautions: Fall Restrictions Weight Bearing Restrictions: No RLE Weight Bearing: Weight bearing as tolerated    Mobility  Bed Mobility Overal bed mobility: Needs Assistance Bed Mobility: Supine to Sit     Supine to sit: Min assist;HOB elevated     General bed mobility comments: Assist for R LE. Increased time.   Transfers Overall transfer level: Needs assistance Equipment used: Rolling walker (2 wheeled) Transfers: Sit to/from Stand Sit to Stand: Min guard;From elevated surface         General transfer comment: Close guard for safety. VCs hand/LE placement. Increased time.   Ambulation/Gait Ambulation/Gait assistance: Min guard Ambulation Distance (Feet): 50 Feet Assistive device: Rolling walker (2 wheeled) Gait Pattern/deviations: Step-to pattern     General Gait Details: slow gait speed. close guard for safety. VCs sequence.    Stairs Stairs: Yes Min Assist Stair Management: Step to pattern;With walker;Forwards Number of Stairs: 1 General stair comments: VCs safety, sequence, technique. Small amount of assist to steady and for safety  Wheelchair Mobility    Modified Rankin (Stroke Patients Only)       Balance                                            Cognition Arousal/Alertness: Awake/alert Behavior During  Therapy: WFL for tasks assessed/performed Overall Cognitive Status: Within Functional Limits for tasks assessed                                        Exercises Total Joint Exercises Ankle Circles/Pumps: AROM;Both;10 reps;Supine Quad Sets: AROM;Both;10 reps;Supine Heel Slides: AAROM;Right;10 reps;Supine Hip ABduction/ADduction: AAROM;Right;10 reps;Supine    General Comments        Pertinent Vitals/Pain Pain Assessment: 0-10 Pain Score: 4  Pain Location: R hip Pain Descriptors / Indicators: Sore;Aching Pain Intervention(s): Monitored during session    Home Living                      Prior Function            PT Goals (current goals can now be found in the care plan section) Progress towards PT goals: Progressing toward goals    Frequency    7X/week      PT Plan Current plan remains appropriate    Co-evaluation              AM-PAC PT "6 Clicks" Daily Activity  Outcome Measure  Difficulty turning over in bed (including adjusting bedclothes, sheets and blankets)?: A Lot Difficulty moving from lying on back to sitting on the side of the bed? : Unable Difficulty sitting down on and standing up from a  chair with arms (e.g., wheelchair, bedside commode, etc,.)?: A Little Help needed moving to and from a bed to chair (including a wheelchair)?: A Little Help needed walking in hospital room?: A Little Help needed climbing 3-5 steps with a railing? : A Lot 6 Click Score: 14    End of Session Equipment Utilized During Treatment: Gait belt Activity Tolerance: Patient tolerated treatment well Patient left: in bed;with call bell/phone within reach(sitting EOB with family members. awaiting assistance from NT)   PT Visit Diagnosis: Difficulty in walking, not elsewhere classified (R26.2);Pain;Other abnormalities of gait and mobility (R26.89) Pain - Right/Left: Left Pain - part of body: Hip     Time: [id number] PT Time Calculation (min)  (ACUTE ONLY): 16 min  Charges:  $Gait Training: 8-22 mins                    G Codes:          Rebeca AlertJannie Paislei Dorval, MPT Pager: [phone]

## 2017-07-21 NOTE — Care Management Note (Addendum)
Case Management Note  Patient Details  Name: Megan [name] MRN: [id number] Date of Birth: April 17, 1971  Subjective/Objective:   S/p R THA                 Action/Plan: NCm spoke to pt and offered choice for HH/list provided. Pt agreeable to [facility]. They will be able to provide answer on 07/22/2017 if they are able to accept referral. [facility] and 3n1 bedside commode. They will deliver to room in am, 07/22/2017.   [facility] delivered DME to room.   Expected Discharge Date:                  Expected Discharge Plan:  Home w Home Health Services  In-House Referral:  NA  Discharge planning Services  CM Consult  Post Acute Care Choice:  Home Health Choice offered to:  Patient  DME Arranged:  3-N-1, Walker rolling DME Agency:  [facility]  [facility]  [facility]:     Status of Service:  In process, will continue to follow  If discussed at Long Length of Stay Meetings, dates discussed:    Additional Comments:  Elliot CousinShavis, Tayanna Talford Ellen, RN 07/21/2017, 5:00 PM

## 2017-07-22 NOTE — Progress Notes (Signed)
Subjective: 3 Days Post-Op Procedure(s) (LRB): RIGHT TOTAL HIP ARTHROPLASTY ANTERIOR APPROACH (Right) Patient reports pain as mild.   Feeling better about transfers today.  Objective: Vital signs in last 24 hours: Temp:  [98.6 F (37 C)-99.2 F (37.3 C)] 98.6 F (37 C) (03/18 0605) Pulse Rate:  [76-80] 76 (03/18 0605) Resp:  [14-18] 17 (03/18 0605) BP: (107-128)/(62-67) 107/67 (03/18 0605) SpO2:  [99 %-100 %] 100 % (03/18 0605)  Intake/Output from previous day: 03/17 0701 - 03/18 0700 In: 1020 [P.O.:1020] Out: -  Intake/Output this shift: No intake/output data recorded.  Recent Labs    07/20/17 0601 07/21/17 0532  HGB 10.6* 10.3*   Recent Labs    07/20/17 0601 07/21/17 0532  WBC 9.0 6.3  RBC 3.90 3.79*  HCT 33.3* 32.3*  PLT 234 198   Recent Labs    07/20/17 0601  NA 138  K 4.1  CL 107  CO2 25  BUN 8  CREATININE 0.82  GLUCOSE 132*  CALCIUM 8.2*   No results for input(s): LABPT, INR in the last 72 hours.  Dorsiflexion/Plantar flexion intact Incision: dressing C/D/I Compartment soft  Assessment/Plan: 3 Days Post-Op Procedure(s) (LRB): RIGHT TOTAL HIP ARTHROPLASTY ANTERIOR APPROACH (Right) Discharge home with home health  GILBERT CLARK 07/22/2017, 8:06 AM

## 2017-07-22 NOTE — Discharge Summary (Signed)
Patient ID: Megan [name] MRN: [id number] DOB/AGE: April 20, 1971 47 y.o.  Admit date: 07/19/2017 Discharge date: 07/22/2017  Admission Diagnoses:  Principal Problem:   Unilateral primary osteoarthritis, right hip Active Problems:   Status post total replacement of right hip   Discharge Diagnoses:  Same  Past Medical History:  Diagnosis Date  . Arthritis   . Complication of anesthesia    the last surgeries she has had patient states she woke up during surgery  . Depression Dx 2010  . Hypertension Dx 2015   last time meds taken - 2016   . OCD (obsessive compulsive disorder)   . PONV (postoperative nausea and vomiting)   . Tuberculosis    latent TB- 2000 - received treatment     Surgeries: Procedure(s): RIGHT TOTAL HIP ARTHROPLASTY ANTERIOR APPROACH on 07/19/2017   Consultants:   Discharged Condition: Improved  Hospital Course: Megan [name] is an 47 y.o. female who was admitted 07/19/2017 for operative treatment ofUnilateral primary osteoarthritis, right hip. Patient has severe unremitting pain that affects sleep, daily activities, and work/hobbies. After pre-op clearance the patient was taken to the operating room on 07/19/2017 and underwent  Procedure(s): RIGHT TOTAL HIP ARTHROPLASTY ANTERIOR APPROACH.    Patient was given perioperative antibiotics:  Anti-infectives (From admission, onward)   Start     Dose/Rate Route Frequency Ordered Stop   07/19/17 1700  ceFAZolin (ANCEF) IVPB 1 g/50 mL premix     1 g 100 mL/hr over 30 Minutes Intravenous Every 6 hours 07/19/17 1453 07/19/17 2302   07/19/17 0824  ceFAZolin (ANCEF) IVPB 2g/100 mL premix     2 g 200 mL/hr over 30 Minutes Intravenous On call to O.R. 07/19/17 [id number] 07/19/17 1118       Patient was given sequential compression devices, early ambulation, and chemoprophylaxis to prevent DVT.  Patient benefited maximally from hospital stay and there were no complications.    Recent vital signs:  Patient Vitals for the past  24 hrs:  BP Temp Temp src Pulse Resp SpO2  07/22/17 0605 107/67 98.6 F (37 C) Oral 76 17 100 %  07/21/17 2049 112/67 99.2 F (37.3 C) Oral 80 14 100 %  07/21/17 1350 128/62 98.7 F (37.1 C) Oral 79 18 99 %     Recent laboratory studies:  Recent Labs    07/20/17 0601 07/21/17 0532  WBC 9.0 6.3  HGB 10.6* 10.3*  HCT 33.3* 32.3*  PLT 234 198  NA 138  --   K 4.1  --   CL 107  --   CO2 25  --   BUN 8  --   CREATININE 0.82  --   GLUCOSE 132*  --   CALCIUM 8.2*  --      Discharge Medications:   Allergies as of 07/22/2017   No Known Allergies     Medication List    STOP taking these medications   meloxicam 7.5 MG tablet Commonly known as:  MOBIC   naproxen sodium 220 MG tablet Commonly known as:  ALEVE   tiZANidine 4 MG tablet Commonly known as:  ZANAFLEX     TAKE these medications   aspirin 325 MG EC tablet Take 1 tablet (325 mg total) by mouth 2 (two) times daily after a meal.   gabapentin 300 MG capsule Commonly known as:  NEURONTIN Take 1 capsule (300 mg total) by mouth at bedtime.   methocarbamol 500 MG tablet Commonly known as:  ROBAXIN Take 1 tablet (500 mg total) by mouth every 6 (  six) hours as needed for muscle spasms.   oxaprozin 600 MG tablet Commonly known as:  DAYPRO Take 1 tablet (600 mg total) by mouth 2 (two) times daily after a meal.   oxyCODONE 5 MG immediate release tablet Commonly known as:  Oxy IR/ROXICODONE Take 1-2 tablets (5-10 mg total) by mouth every 4 (four) hours as needed for moderate pain (pain score 4-6).   Vitamin D (Ergocalciferol) 50000 units Caps capsule Commonly known as:  DRISDOL Take 50,000 Units by mouth every Thursday. IN THE MORNING.            Durable Medical Equipment  (From admission, onward)        Start     Ordered   07/19/17 1454  DME Walker rolling  Once    Question:  Patient needs a walker to treat with the following condition  Answer:  Status post total replacement of right hip   07/19/17  1453   07/19/17 1454  DME 3 n 1  Once     07/19/17 1453      Diagnostic Studies: Dg Pelvis Portable  Result Date: 07/19/2017 CLINICAL DATA:  Status post right total hip replacement. EXAM: PORTABLE PELVIS 1-2 VIEWS COMPARISON:  Fluoroscopic images of same day. Radiographs of April 03, 2017. FINDINGS: The femoral and acetabular components appear to be well situated. No fracture or dislocation is noted. Expected postoperative changes are noted in the surrounding soft tissues. IMPRESSION: Status post right total hip arthroplasty. Electronically Signed   By: Lupita Raider, M.D.   On: 07/19/2017 13:17   Dg C-arm 1-60 Min-no Report  Result Date: 07/19/2017 Fluoroscopy was utilized by the requesting physician.  No radiographic interpretation.   Dg Hip Operative Unilat W Or W/o Pelvis Right  Result Date: 07/19/2017 CLINICAL DATA:  Right total hip arthroplasty. EXAM: OPERATIVE RIGHT HIP (WITH PELVIS IF PERFORMED) TECHNIQUE: Fluoroscopic spot image(s) were submitted for interpretation post-operatively. COMPARISON:  Right hip x-rays dated April 03, 2017. FINDINGS: Intraoperative x-rays demonstrate interval right total hip arthroplasty. Components are well aligned. No acute fracture. IMPRESSION: Right total hip arthroplasty.  No acute abnormality. FLUOROSCOPY TIME:  40 seconds. Electronically Signed   By: Obie Dredge M.D.   On: 07/19/2017 12:22    Disposition: Discharge disposition: 01-Home or Self Care         Follow-up Information    Kathryne Hitch, MD. Schedule an appointment as soon as possible for a visit in 2 week(s).   Specialty:  Orthopedic Surgery Contact information: [address] [place] [address] [phone]            Signed: Richardean Canal 07/22/2017, 8:08 AM

## 2017-07-22 NOTE — Progress Notes (Signed)
Physical Therapy Treatment Patient Details Name: Megan [name] MRN: [id number] DOB: [birth year] Today's Date: 07/22/2017    History of Present Illness Pt is a 47 y.o. female s/p R THA.    PT Comments    POD # 3 Assisted OOB to bathroom then amb in hallway a limited distance due to need to practice multiple steps as pt lives in a 2 story with bedroom and shower upstairs.    Follow Up Recommendations  Follow surgeon's recommendation for DC plan and follow-up therapies     Equipment Recommendations  Rolling walker with 5" wheels    Recommendations for Other Services       Precautions / Restrictions Precautions Precautions: Fall Restrictions Weight Bearing Restrictions: No RLE Weight Bearing: Weight bearing as tolerated    Mobility  Bed Mobility Overal bed mobility: Needs Assistance Bed Mobility: Supine to Sit;Sit to Supine     Supine to sit: Min assist;HOB elevated Sit to supine: Mod assist   General bed mobility comments: Assist for R LE. Increased time.   Transfers Overall transfer level: Needs assistance Equipment used: Rolling walker (2 wheeled) Transfers: Sit to/from Stand Sit to Stand: Supervision;Min guard Stand pivot transfers: Supervision;Min guard       General transfer comment: Close guard for safety. VCs hand/LE placement. Increased time.   Also assisted in bathroom.  Ambulation/Gait Ambulation/Gait assistance: Supervision Ambulation Distance (Feet): 32 Feet Assistive device: Rolling walker (2 wheeled) Gait Pattern/deviations: Step-to pattern Gait velocity: decreased   General Gait Details: slow gait speed. close guard for safety. VCs sequence.   decreased distance due to perfoming stairs.     Stairs Stairs: Yes   Stair Management: One rail Right;Step to pattern;Forwards Number of Stairs: 5 General stair comments: pt has a flight of stairs to get to her bedroom/shower.  with 25% VC's on proper tech and sequencing.    Wheelchair  Mobility    Modified Rankin (Stroke Patients Only)       Balance                                            Cognition Arousal/Alertness: Awake/alert Behavior During Therapy: WFL for tasks assessed/performed Overall Cognitive Status: Within Functional Limits for tasks assessed                                        Exercises      General Comments        Pertinent Vitals/Pain Pain Assessment: 0-10 Pain Score: 5  Pain Location: R hip Pain Descriptors / Indicators: Sore;Aching;Operative site guarding Pain Intervention(s): Monitored during session;Premedicated before session;Repositioned;Ice applied    Home Living                      Prior Function            PT Goals (current goals can now be found in the care plan section) Progress towards PT goals: Progressing toward goals    Frequency    7X/week      PT Plan Current plan remains appropriate    Co-evaluation              AM-PAC PT "6 Clicks" Daily Activity  Outcome Measure  Difficulty turning over in bed (including adjusting bedclothes, sheets and blankets)?: A Lot  Difficulty moving from lying on back to sitting on the side of the bed? : A Lot Difficulty sitting down on and standing up from a chair with arms (e.g., wheelchair, bedside commode, etc,.)?: A Little Help needed moving to and from a bed to chair (including a wheelchair)?: A Little Help needed walking in hospital room?: A Little Help needed climbing 3-5 steps with a railing? : A Lot 6 Click Score: 15    End of Session Equipment Utilized During Treatment: Gait belt Activity Tolerance: Patient tolerated treatment well Patient left: in bed;with call bell/phone within reach Nurse Communication: Mobility status(pt ready for D/C to home) PT Visit Diagnosis: Difficulty in walking, not elsewhere classified (R26.2);Pain;Other abnormalities of gait and mobility (R26.89) Pain - Right/Left: Right Pain  - part of body: Hip     Time: 1610-96040944-1009 PT Time Calculation (min) (ACUTE ONLY): 25 min  Charges:  $Gait Training: 8-22 mins $Therapeutic Activity: 8-22 mins                    G Codes:       {Eryx Zane  PTA [facility]  Acute  Rehab Pager      [phone]

## 2017-07-23 ENCOUNTER — Telehealth (INDEPENDENT_AMBULATORY_CARE_PROVIDER_SITE_OTHER): Payer: Self-pay | Admitting: Orthopaedic Surgery

## 2017-07-23 NOTE — Telephone Encounter (Signed)
Patient called asking for a note for her son stating that he stayed at the hospital with his mother this past Saturday (3/16). CB # [phone]

## 2017-07-24 ENCOUNTER — Encounter (INDEPENDENT_AMBULATORY_CARE_PROVIDER_SITE_OTHER): Payer: Self-pay

## 2017-07-24 NOTE — Telephone Encounter (Signed)
Patient aware note at front desk  

## 2017-07-25 ENCOUNTER — Telehealth (INDEPENDENT_AMBULATORY_CARE_PROVIDER_SITE_OTHER): Payer: Self-pay | Admitting: Orthopaedic Surgery

## 2017-07-25 NOTE — Telephone Encounter (Signed)
Megan [name] @ [facility] requesting verbal orders for PT for:2 times a week for 1 week & 3 times a week for 1 week. Megan [name] call back #[phone]

## 2017-07-25 NOTE — Telephone Encounter (Signed)
Verbal order given  

## 2017-08-01 ENCOUNTER — Ambulatory Visit (INDEPENDENT_AMBULATORY_CARE_PROVIDER_SITE_OTHER): Payer: BLUE CROSS/BLUE SHIELD | Admitting: Orthopaedic Surgery

## 2017-08-01 ENCOUNTER — Encounter (INDEPENDENT_AMBULATORY_CARE_PROVIDER_SITE_OTHER): Payer: Self-pay | Admitting: Orthopaedic Surgery

## 2017-08-01 DIAGNOSIS — Z96641 Presence of right artificial hip joint: Secondary | ICD-10-CM

## 2017-08-01 NOTE — Progress Notes (Signed)
The patient is 2 weeks status post a right total hip arthroplasty.  She is doing well overall.  She has normal home therapy visit.  She is using a walker still.  She says she only takes pain medication at night pain she has been on aspirin daily as well.  On exam her incision looks good.  The staples have been removed and Steri-Strips applied.  Her right hip moves well.  Her leg lengths are equal.  At this point she will continue increase her activities as comfort allows.  She will work on a home exercise program.  We will see her back in 4 weeks to see how she is doing overall but no x-rays are needed.  All questions and concerns were answered and addressed.  She will stop aspirin after next week.

## 2017-09-03 ENCOUNTER — Encounter (INDEPENDENT_AMBULATORY_CARE_PROVIDER_SITE_OTHER): Payer: Self-pay | Admitting: Orthopaedic Surgery

## 2017-09-03 ENCOUNTER — Ambulatory Visit (INDEPENDENT_AMBULATORY_CARE_PROVIDER_SITE_OTHER): Payer: BLUE CROSS/BLUE SHIELD | Admitting: Orthopaedic Surgery

## 2017-09-03 DIAGNOSIS — Z96641 Presence of right artificial hip joint: Secondary | ICD-10-CM

## 2017-09-03 NOTE — Progress Notes (Signed)
HPI: Mr. Diffee returns today 45 days status post right total hip arthroplasty.  She is overall doing very well.  She is no longer using any assistive devices.  She is wanting to return to work on May 27 full duties with the only restrictions being needing to use a stool for periodic sitting, no lifting greater than 20 pounds and no repetitive bending at the waist or stooping.  She denies any fevers chills shortness of breath chest pain.  Physical exam right hip excellent range of motion without pain right calf supple nontender dorsiflexion plantarflexion of the foot is intact.  She ambulates without any assistive devices and a nonantalgic gait.  She easily transfers from sitting position to standing position on her own.  Impression: Status post right total hip arthroplasty 45 days  Plan: See her back in 3 months due to the limitations put on her returning to work see if we can remove these at that time.  She will work on scar tissue mobilization.  Discussed with her the use of Mederma.  Also discussed with her the need for predental work antibiotics if she has anything within the first 3 months of surgery.  No radiographs at return visit.  She can always return sooner if she has any questions or concerns.  She was given a note returning to work on May 27 full duties with the restrictions of needing to use a stool to sit periodically, no lifting greater than 25 pounds, no repetitive bending at the waist or stooping.

## 2017-10-07 ENCOUNTER — Telehealth (INDEPENDENT_AMBULATORY_CARE_PROVIDER_SITE_OTHER): Payer: Self-pay | Admitting: Orthopaedic Surgery

## 2017-10-07 NOTE — Telephone Encounter (Signed)
Patient said she received a return to work/restriction form from her job. She said her position with her company is only secure until 6/6. And with Keane Scrapeirsa being on vacation until 6/5 I'm not sure if this is something Dr. Magnus IvanBlackman or Bronson CurbGil would be able to fill out for her. Please advise patient # [phone]

## 2017-10-07 NOTE — Telephone Encounter (Signed)
We did perform a hip replacement on her in March of this year so she is likely been out of work because of that.  She can return to work at this point from my standpoint with the only restriction being no lifting greater than 25 pounds and no climbing ladders.  She should also avoid crawling and squatting.

## 2017-10-07 NOTE — Telephone Encounter (Signed)
See below, we didn't have her out did we?

## 2017-10-08 NOTE — Telephone Encounter (Signed)
Called patient to give her the below message and she states she will give her the work note we gave her at last visit and she will see if this is sufficient

## 2017-10-10 ENCOUNTER — Encounter (INDEPENDENT_AMBULATORY_CARE_PROVIDER_SITE_OTHER): Payer: Self-pay

## 2017-10-10 ENCOUNTER — Telehealth (INDEPENDENT_AMBULATORY_CARE_PROVIDER_SITE_OTHER): Payer: Self-pay | Admitting: Orthopaedic Surgery

## 2017-10-10 NOTE — Progress Notes (Signed)
Patin

## 2017-10-10 NOTE — Telephone Encounter (Signed)
Patient said she spoke with you about a letter that she is needing for her employer. If you could give her a call back at [phone]

## 2017-10-10 NOTE — Telephone Encounter (Signed)
Note give to patient

## 2017-11-06 ENCOUNTER — Telehealth (INDEPENDENT_AMBULATORY_CARE_PROVIDER_SITE_OTHER): Payer: Self-pay | Admitting: Orthopaedic Surgery

## 2017-11-06 MED ORDER — OXYCODONE HCL 5 MG PO TABS: 5.0000 mg | ORAL_TABLET | Freq: Four times a day (QID) | ORAL | 0 refills | Status: DC | PRN

## 2017-11-06 NOTE — Telephone Encounter (Signed)
Patient requesting rx refill on oxycodone. She did ask that it be faxed into the pharmacy if possible but I told her most likely she will need to pick it up. Patients # [phone]

## 2017-11-06 NOTE — Telephone Encounter (Signed)
I did E scribe in some more oxycodone to her pharmacy.  Do let her know that and also let her know that this would be the last prescription we will provide for that medication since she is getting close to 4 months out from her surgery.

## 2017-11-06 NOTE — Telephone Encounter (Signed)
Please advise 

## 2017-11-06 NOTE — Telephone Encounter (Signed)
Patient aware of the below message  

## 2017-12-04 ENCOUNTER — Ambulatory Visit (INDEPENDENT_AMBULATORY_CARE_PROVIDER_SITE_OTHER): Payer: BLUE CROSS/BLUE SHIELD | Admitting: Orthopaedic Surgery

## 2018-05-12 ENCOUNTER — Other Ambulatory Visit: Payer: Self-pay

## 2018-05-12 ENCOUNTER — Ambulatory Visit (HOSPITAL_COMMUNITY)
Admission: EM | Admit: 2018-05-12 | Discharge: 2018-05-12 | Disposition: A | Discharge status: Home / Self Care | Payer: BLUE CROSS/BLUE SHIELD | Attending: Internal Medicine | Admitting: Internal Medicine

## 2018-05-12 ENCOUNTER — Encounter (HOSPITAL_COMMUNITY): Payer: Self-pay

## 2018-05-12 DIAGNOSIS — Z3202 Encounter for pregnancy test, result negative: Secondary | ICD-10-CM

## 2018-05-12 DIAGNOSIS — Z711 Person with feared health complaint in whom no diagnosis is made: Secondary | ICD-10-CM | POA: Diagnosis not present

## 2018-05-12 DIAGNOSIS — N898 Other specified noninflammatory disorders of vagina: Secondary | ICD-10-CM

## 2018-05-12 LAB — POCT PREGNANCY, URINE: PREG TEST UR: NEGATIVE

## 2018-05-12 MED ORDER — LIDOCAINE HCL (PF) 1 % IJ SOLN
INTRAMUSCULAR | Status: AC
  Filled 2018-05-12: qty 2

## 2018-05-12 MED ORDER — AZITHROMYCIN 250 MG PO TABS
1000.0000 mg | ORAL_TABLET | Freq: Once | ORAL | Status: AC
  Administered 2018-05-12: 1000 mg via ORAL

## 2018-05-12 MED ORDER — AZITHROMYCIN 250 MG PO TABS
ORAL_TABLET | ORAL | Status: AC
  Filled 2018-05-12: qty 4

## 2018-05-12 MED ORDER — CEFTRIAXONE SODIUM 250 MG IJ SOLR
INTRAMUSCULAR | Status: AC
  Filled 2018-05-12: qty 250

## 2018-05-12 MED ORDER — CEFTRIAXONE SODIUM 250 MG IJ SOLR
250.0000 mg | Freq: Once | INTRAMUSCULAR | Status: AC
  Administered 2018-05-12: 250 mg via INTRAMUSCULAR

## 2018-05-12 NOTE — ED Triage Notes (Signed)
Pt cc vaginal discharge x 1 month or more. Pt states she has pelvis pain ( tenderness )

## 2018-05-12 NOTE — ED Notes (Signed)
Urine in lab 

## 2018-05-12 NOTE — Discharge Instructions (Signed)
Today we treated you for Chlamydia and gonorrhea propholactically. We will call you or send you a Mychart message with results. Make sure you get a pap with your provider this year.

## 2018-05-12 NOTE — ED Provider Notes (Signed)
[facility]    CSN: [id number] Arrival date & time: 05/12/18  6546     History   Chief Complaint Chief Complaint  Patient presents with  . Vaginal Discharge    HPI Megan [name] is a 48 y.o. female.    Has been having abnormal yellow vaginal discharge x 2 months. Denies an odor or itching.  She has had occasional pelvic pain and worse during the time she skipped her mensis. She did not do a pregnancy test and is not on anything to prevent pregnancy. She missed her period in November when expected and came on 12/13 but was very heavy for her bleed 4-5 days, and her normal is light and only a couple of days. The pelvic pain has resolved. She states she has been with the same female partner x 8 years, but they did brake up last year and got back together in April 2019.        Past Medical History:  Diagnosis Date  . Arthritis   . Complication of anesthesia    the last surgeries she has had patient states she woke up during surgery  . Depression Dx 2010  . Hypertension Dx 2015   last time meds taken - 2016   . OCD (obsessive compulsive disorder)   . PONV (postoperative nausea and vomiting)   . Tuberculosis    latent TB- 2000 - received treatment     Patient Active Problem List   Diagnosis Date Noted  . Status post total replacement of right hip 07/19/2017  . Unilateral primary osteoarthritis, right hip 04/18/2017  . Pain of right hip joint 04/18/2017  . Chronic pain of right hip 07/20/2014  . Memory loss 07/20/2014  . Sciatica 01/25/2014  . Essential hypertension 01/25/2014  . Hair thinning 10/16/2013  . Depression 10/13/2013  . Anxiety state, unspecified 10/13/2013  . Leg cramps 10/13/2013  . Other screening mammogram 10/13/2013    Past Surgical History:  Procedure Laterality Date  . COLONOSCOPY    . NO PAST SURGERIES    . TOOTH EXTRACTION    . TOTAL HIP ARTHROPLASTY Right 07/19/2017   Procedure: RIGHT TOTAL HIP ARTHROPLASTY ANTERIOR APPROACH;   Surgeon: Kathryne Hitch, MD;  Location: [facility];  Service: Orthopedics;  Laterality: Right;    OB History    Gravida  9   Para  3   Term      Preterm      AB  6   Living  3     SAB      TAB  6   Ectopic      Multiple      Live Births               Home Medications    Prior to Admission medications   Medication Sig Start Date End Date Taking? Authorizing Provider  aspirin EC 325 MG EC tablet Take 1 tablet (325 mg total) by mouth 2 (two) times daily after a meal. 07/21/17   Chestine Spore, Allayne Gitelman, PA-C  methocarbamol (ROBAXIN) 500 MG tablet Take 1 tablet (500 mg total) by mouth every 6 (six) hours as needed for muscle spasms. 07/21/17   Kirtland Bouchard, PA-C  oxyCODONE (OXY IR/ROXICODONE) 5 MG immediate release tablet Take 1-2 tablets (5-10 mg total) by mouth every 6 (six) hours as needed for moderate pain (pain score 4-6). 11/06/17   Kathryne Hitch, MD  Vitamin D, Ergocalciferol, (DRISDOL) 50000 units CAPS capsule Take 50,000 Units by mouth  every Thursday. IN THE MORNING.    [provider]    Family History Family History  Problem Relation Age of Onset  . Cancer Mother   . Cancer Father     Social History Social History   Tobacco Use  . Smoking status: Never Smoker  . Smokeless tobacco: Never Used  Substance Use Topics  . Alcohol use: No  . Drug use: No     Allergies   Patient has no known allergies.   Review of Systems Review of Systems  Constitutional: Positive for diaphoresis. Negative for chills and fever.  Gastrointestinal: Negative for abdominal pain.  Genitourinary: Positive for frequency, menstrual problem, pelvic pain and vaginal discharge. Negative for decreased urine volume, dysuria, genital sores, urgency and vaginal bleeding.  Musculoskeletal: Positive for myalgias. Negative for gait problem.  Skin: Negative for rash.     Physical Exam Triage Vital Signs ED Triage Vitals  Enc Vitals Group     BP 05/12/18  0952 124/75     Pulse --      Resp 05/12/18 0952 18     Temp 05/12/18 0952 99.5 F (37.5 C)     Temp Source 05/12/18 0952 Oral     SpO2 05/12/18 0952 100 %     Weight 05/12/18 0951 240 lb (108.9 kg)     Height --      Head Circumference --      Peak Flow --      Pain Score 05/12/18 0950 1     Pain Loc --      Pain Edu? --      Excl. in GC? --    No data found.  Updated Vital Signs BP 124/75 (BP Location: Right Arm)   Temp 99.5 F (37.5 C) (Oral)   Resp 18   Wt 240 lb (108.9 kg)   LMP 04/18/2018   SpO2 100%   BMI 36.49 kg/m   Visual Acuity Right Eye Distance:   Left Eye Distance:   Bilateral Distance:    Right Eye Near:   Left Eye Near:    Bilateral Near:     Physical Exam Vitals signs and nursing note reviewed.  Constitutional:      General: She is not in acute distress.    Appearance: Normal appearance. She is not toxic-appearing.  HENT:     Head: Normocephalic.     Right Ear: External ear normal.     Left Ear: External ear normal.     Nose: Nose normal.  Eyes:     General: No scleral icterus.    Conjunctiva/sclera: Conjunctivae normal.  Neck:     Musculoskeletal: Neck supple.  Pulmonary:     Effort: Pulmonary effort is normal.  Abdominal:     General: Abdomen is flat. Bowel sounds are normal. There is no distension.     Palpations: There is no mass.     Tenderness: There is no abdominal tenderness. There is no guarding or rebound.     Hernia: No hernia is present.  Genitourinary:    Comments: Patient did a self swab, and since she has not had any more pelvic pain, I did not do a bimanual exam, and Pt was Ok with this when I asked her and explained the reason for this.  Musculoskeletal: Normal range of motion.  Skin:    General: Skin is warm and dry.  Neurological:     General: No focal deficit present.     Mental Status: She is alert and  oriented to person, place, and time.  Psychiatric:        Mood and Affect: Mood normal.        Behavior:  Behavior normal.        Thought Content: Thought content normal.        Judgment: Judgment normal.      UC Treatments / Results  Labs (all labs ordered are listed, but only abnormal results are displayed) Labs Reviewed  HIV ANTIBODY (ROUTINE TESTING W REFLEX)  POC URINE PREG, ED  POCT PREGNANCY, URINE  CERVICOVAGINAL ANCILLARY ONLY    EKG None  Radiology No results found.  Procedures Procedures (including critical care time)  Medications Ordered in UC Medications  azithromycin (ZITHROMAX) tablet 1,000 mg (has no administration in time range)  cefTRIAXone (ROCEPHIN) injection 250 mg (has no administration in time range)    Initial Impression / Assessment and Plan / UC Course  I have reviewed the triage vital signs and the nursing notes.  Pertinent labs  results that were available during my care of the patient were reviewed by me and considered in my medical decision making (see chart for details). She was treated prophylactic with Rocephin 250 mg IM and Zityhromax 1 G here.  She was advised to not have sex til she knows her results and if they turn positive, to have partner be treated and Fu with provider to repeat test and needs pap done as well( if if STD test is neg). Pt agreed.  Clinical Course as of May 12 1044  Mon May 12, 2018  1033 negative  POC urine pregnancy [SR]    Clinical Course User Index [SR] Rodriguez-Southworth, Nettie ElmSylvia, [place]-C     Final Clinical Impressions(s) / UC Diagnoses   Final diagnoses:  Concern about STD in female without diagnosis     Discharge Instructions     Today we treated you for Chlamydia and gonorrhea propholactically. We will call you or send you a Mychart message with results. Make sure you get a pap with your provider this year.     ED Prescriptions    None     Controlled Substance Prescriptions [place] Controlled Substance Registry consulted? no   Garey HamRodriguez-Southworth, Jannell Franta, [place]-C 05/12/18 1048

## 2018-05-13 LAB — CERVICOVAGINAL ANCILLARY ONLY
CHLAMYDIA, DNA PROBE: NEGATIVE
NEISSERIA GONORRHEA: NEGATIVE
TRICH (WINDOWPATH): NEGATIVE

## 2018-05-13 LAB — HIV ANTIBODY (ROUTINE TESTING W REFLEX): HIV SCREEN 4TH GENERATION: NONREACTIVE

## 2018-07-23 ENCOUNTER — Ambulatory Visit (INDEPENDENT_AMBULATORY_CARE_PROVIDER_SITE_OTHER): Payer: BLUE CROSS/BLUE SHIELD | Admitting: Physician Assistant

## 2018-09-01 ENCOUNTER — Ambulatory Visit (INDEPENDENT_AMBULATORY_CARE_PROVIDER_SITE_OTHER): Payer: Self-pay

## 2018-09-01 ENCOUNTER — Ambulatory Visit (INDEPENDENT_AMBULATORY_CARE_PROVIDER_SITE_OTHER): Payer: BLUE CROSS/BLUE SHIELD | Admitting: Orthopaedic Surgery

## 2018-09-01 ENCOUNTER — Encounter (INDEPENDENT_AMBULATORY_CARE_PROVIDER_SITE_OTHER): Payer: Self-pay | Admitting: Orthopaedic Surgery

## 2018-09-01 ENCOUNTER — Other Ambulatory Visit: Payer: Self-pay

## 2018-09-01 ENCOUNTER — Other Ambulatory Visit (INDEPENDENT_AMBULATORY_CARE_PROVIDER_SITE_OTHER): Payer: Self-pay

## 2018-09-01 DIAGNOSIS — M5441 Lumbago with sciatica, right side: Secondary | ICD-10-CM | POA: Diagnosis not present

## 2018-09-01 DIAGNOSIS — M7061 Trochanteric bursitis, right hip: Secondary | ICD-10-CM | POA: Diagnosis not present

## 2018-09-01 DIAGNOSIS — Z96641 Presence of right artificial hip joint: Secondary | ICD-10-CM

## 2018-09-01 DIAGNOSIS — M2550 Pain in unspecified joint: Principal | ICD-10-CM

## 2018-09-01 DIAGNOSIS — G8929 Other chronic pain: Secondary | ICD-10-CM

## 2018-09-01 MED ORDER — LIDOCAINE HCL 1 % IJ SOLN
3.0000 mL | INTRAMUSCULAR | Status: AC | PRN
  Administered 2018-09-01: 3 mL

## 2018-09-01 MED ORDER — METHYLPREDNISOLONE ACETATE 40 MG/ML IJ SUSP
40.0000 mg | INTRAMUSCULAR | Status: AC | PRN
  Administered 2018-09-01: 09:00:00 40 mg via INTRA_ARTICULAR

## 2018-09-01 NOTE — Progress Notes (Signed)
Office Visit Note   Patient: Megan [name]           Date of Birth: 16-Aug-1970           MRN: [id number] Visit Date: 09/01/2018              Requested by: Megan Contras, MD [address] [place], [place] [address] PCP: Megan Contras, MD   Assessment & Plan: Visit Diagnoses:  1. History of right hip replacement   2. Right-sided low back pain with right-sided sciatica, unspecified chronicity   3. Trochanteric bursitis, right hip     Plan: We will send her to formal physical therapy to work on generalized stretching particularly at the IT band and hamstrings, strengthening of the lower extremities also to include modalities and home exercise program.  Due to multiple arthralgias we will send her to rheumatology for formal work-up possible systemic source of her multiple arthralgias.  Again she does have a family history with grandmother maternal aunt with she believes it was rheumatoid arthritis.  Follow-Up Instructions: Return if symptoms worsen or fail to improve.   Orders:  Orders Placed This Encounter  Procedures  . Large Joint Inj  . XR HIP UNILAT W OR W/O PELVIS 1V RIGHT  . XR Lumbar Spine 2-3 Views   No orders of the defined types were placed in this encounter.     Procedures: Large Joint Inj on 09/01/2018 9:15 AM Indications: pain Details: 22 G 1.5 in needle, lateral approach  Arthrogram: No  Medications: 3 mL lidocaine 1 %; 40 mg methylPREDNISolone acetate 40 MG/ML Outcome: tolerated well, no immediate complications Procedure, treatment alternatives, risks and benefits explained, specific risks discussed. Consent was given by the patient. Immediately prior to procedure a time out was called to verify the correct patient, procedure, equipment, support staff and site/side marked as required. Patient was prepped and draped in the usual sterile fashion.       Clinical Data: No additional findings.   Subjective: Chief Complaint  Patient presents with  .  Right Hip - Follow-up    HPI  Megan [name] 48 year old female status post right total hip arthroplasty 07/19/2017.  States that her hip is been aching a lot sore more in the leg anterior aspect of the right thigh.  She is having no groin pain.  She does have some pain in her right buttocks region:"as if the screw is being turned".  She notes that she cannot lie on either hip for long period time at night due to pain lateral aspect of the hips.  She denies any numbness tingling down either leg.  However she has achy pain that goes down into her feet.  States her right hip pain is constant dull and achy pain.  She notes multiple joints that bother her including both her hands, wrist, elbows and shoulders.  She also notes that both knees bother.  She is been dealing with pain in multiple joints for years she feels that this is getting worse.  She feels stiff in the morning but states that her pain actually gets worse with movement.  Notes that both legs feel weak and heavy.  She believes that her grandmother and maternal aunt both had rheumatoid arthritis but she is unsure.  She has mentioned her multiple arthralgias to her primary care physician. Who she reports has referred her to pain management who she is working with her.  But reports that she needs a psychiatric evaluation to make sure she is competent  to take her medicine as prescribed.  She denies any rashes or skin lesions.  She does note that she gets bruises quite often on her body without having any type of injury.  She denies any fevers or chills.  She reports a history of vitamin D deficiency which she is taking no medications for it.  Review of Systems  Constitutional: Negative for chills and fever.  Musculoskeletal: Positive for arthralgias.  Skin: Negative for rash.  Hematological:       Bruises     Objective: Vital Signs: There were no vitals taken for this visit.  Physical Exam Constitutional:      Appearance: She is not  ill-appearing or diaphoretic.  Cardiovascular:     Pulses: Normal pulses.  Pulmonary:     Effort: Pulmonary effort is normal.  Neurological:     Mental Status: She is alert and oriented to person, place, and time.  Psychiatric:        Mood and Affect: Mood normal.        Behavior: Behavior normal.     Ortho Exam Bilateral hips excellent range of motion without pain.  Tenderness over the right greater trochanteric region.  Negative straight leg raise bilaterally.  Exquisitely tight hamstrings bilaterally.  She is able to walk on her tiptoes and heels.  Deep tendon reflexes are 2+ at the knees and ankles and equal and symmetric.  5/5 strength throughout the lower extremities against resistance.  Bilateral knees no rashes, skin lesions, erythema or effusion.  Tenderness along medial joint line of both knees.  No instability valgus varus stressing of either knee.  Good range of motion of both knees without pain.  Tenderness over the proximal tibial spines bilaterally.  Calf supple nontender.  Good range of motion bilateral ankles feet without pain.  Pes planus bilaterally. Upper extremities.  She has good range of motion bilateral shoulders elbows wrists and hands without pain. Specialty Comments:  No specialty comments available.  Imaging: Xr Hip Unilat W Or W/o Pelvis 1v Right  Result Date: 09/01/2018 AP pelvis lateral view of the right hip: No acute fracture.  Left hip appears well-preserved on the AP view.  Right total hip arthroplasty appears well-seated.  Xr Lumbar Spine 2-3 Views  Result Date: 09/01/2018 Lumbar spine AP and lateral views: Disc space maintained throughout.  No spondylolisthesis.  No acute fractures.  Mild to moderate facet arthritic changes at L5-S1.  Normal lordotic curvature is well-maintained.    PMFS History: Patient Active Problem List   Diagnosis Date Noted  . Status post total replacement of right hip 07/19/2017  . Unilateral primary osteoarthritis, right  hip 04/18/2017  . Pain of right hip joint 04/18/2017  . Chronic pain of right hip 07/20/2014  . Memory loss 07/20/2014  . Sciatica 01/25/2014  . Essential hypertension 01/25/2014  . Hair thinning 10/16/2013  . Depression 10/13/2013  . Anxiety state, unspecified 10/13/2013  . Leg cramps 10/13/2013  . Other screening mammogram 10/13/2013   Past Medical History:  Diagnosis Date  . Arthritis   . Complication of anesthesia    the last surgeries she has had patient states she woke up during surgery  . Depression Dx 2010  . Hypertension Dx 2015   last time meds taken - 2016   . OCD (obsessive compulsive disorder)   . PONV (postoperative nausea and vomiting)   . Tuberculosis    latent TB- 2000 - received treatment     Family History  Problem Relation Age of  Onset  . Cancer Mother   . Cancer Father     Past Surgical History:  Procedure Laterality Date  . COLONOSCOPY    . NO PAST SURGERIES    . TOOTH EXTRACTION    . TOTAL HIP ARTHROPLASTY Right 07/19/2017   Procedure: RIGHT TOTAL HIP ARTHROPLASTY ANTERIOR APPROACH;  Surgeon: Kathryne Hitch, MD;  Location: [facility];  Service: Orthopedics;  Laterality: Right;   Social History   Occupational History  . Not on file  Tobacco Use  . Smoking status: Never Smoker  . Smokeless tobacco: Never Used  Substance and Sexual Activity  . Alcohol use: No  . Drug use: No  . Sexual activity: Yes    Birth control/protection: None

## 2019-10-12 ENCOUNTER — Ambulatory Visit (INDEPENDENT_AMBULATORY_CARE_PROVIDER_SITE_OTHER): Payer: Medicaid Other

## 2019-10-12 ENCOUNTER — Other Ambulatory Visit: Payer: Self-pay

## 2019-10-12 ENCOUNTER — Ambulatory Visit (INDEPENDENT_AMBULATORY_CARE_PROVIDER_SITE_OTHER): Payer: Medicaid Other | Admitting: Physician Assistant

## 2019-10-12 ENCOUNTER — Encounter: Payer: Self-pay | Admitting: Physician Assistant

## 2019-10-12 VITALS — Ht 68.0 in | Wt 280.0 lb

## 2019-10-12 DIAGNOSIS — M7062 Trochanteric bursitis, left hip: Secondary | ICD-10-CM

## 2019-10-12 DIAGNOSIS — M25552 Pain in left hip: Secondary | ICD-10-CM

## 2019-10-12 DIAGNOSIS — M2550 Pain in unspecified joint: Secondary | ICD-10-CM

## 2019-10-12 NOTE — Progress Notes (Signed)
Office Visit Note   Patient: Megan [name]           Date of Birth: [birth year]           MRN: [id number] Visit Date: 10/12/2019              Requested by: Nolene Ebbs, MD [address] [place],  [place] [address] PCP: Nolene Ebbs, MD   Assessment & Plan: Visit Diagnoses:  1. Trochanteric bursitis of left hip   2. Pain in left hip   3. Arthralgia of multiple sites     Plan: Explained to Megan [name] at this point time I recommend that Megan [name] go to formal physical therapy for both her neck and for IT band stretching.  Offered her choke injection on the left Megan [name] defers.  Also would like for her to find out who Megan [name] saw as far as rheumatology in case we need to refer her back to rheumatology in the future.  Megan [name] will follow up with [place] in 6 weeks.  Follow-Up Instructions: Return in about 6 weeks (around 11/23/2019), or Dr. Ninfa Linden.   Orders:  Orders Placed This Encounter  Procedures  . XR HIP UNILAT W OR W/O PELVIS 2-3 VIEWS LEFT  . XR Cervical Spine 2 or 3 views   No orders of the defined types were placed in this encounter.     Procedures: No procedures performed   Clinical Data: No additional findings.   Subjective: Chief Complaint  Patient presents with  . Neck - Pain  . Lower Back - Pain  . Right Hip - Pain  . Left Hip - Pain    HPI  Megan [name] returns today status post trochanteric injection 09/01/2019.  States knee injection did help the right hip some but Megan [name] is now having pain in both hips.  Megan [name] was told by the rheumatologist that Megan [name] possibly has fibromyalgia.  Megan [name] is also complaining of neck pain that Megan [name] states is been ongoing for the last 3 months becoming more constant.  Radiates into her shoulders.  Denies any numbness tingling down either arm.  Megan [name] had no injury to the neck.  Megan [name] states Megan [name] had cervical spine films that were done at [facility] these are not available but states that Megan [name] had a "slippage and arthritis of her neck".  Megan [name] is  currently taking gabapentin ,meloxicam and hydrocodone.  Megan [name] has not been to physical therapy.  Review of Systems   Objective: Vital Signs: Ht 5\' 8"  (1.727 m)   Wt 280 lb (127 kg)   BMI 42.57 kg/m   Physical Exam Constitutional:      Appearance: Megan [name] is not ill-appearing or diaphoretic.  Pulmonary:     Effort: Pulmonary effort is normal.  Neurological:     Mental Status: Megan [name] is alert and oriented to person, place, and time.  Psychiatric:        Mood and Affect: Mood normal.     Ortho Exam Cervical spine good extension flexion without pain.  Megan [name] has discomfort with rotation to the right.  Tenderness over the cervical spinal column.  No tenderness over the medial borders of the scapulas bilaterally.  Out of 5 strength throughout the upper extremities.  Bilateral hips: Good range of motion without pain.  Megan [name] has tenderness over the left trochanteric region.  Specialty Comments:  No specialty comments available.  Imaging: XR HIP UNILAT W OR W/O PELVIS 2-3 VIEWS LEFT  Result Date: 10/12/2019 AP pelvis lateral view left hip: Status  post right total hip arthroplasty with well-seated components.  Bilateral hips are well located.  Left hip with slight periarticular spurs but otherwise the hip joint is well-maintained.  No acute fractures no evidence of AVN.  XR Cervical Spine 2 or 3 views  Result Date: 10/12/2019 Cervical spine 2 views: No acute fractures.  Displays well-maintained.  Loss of lordotic curvature.  No spondylolisthesis.    PMFS History: Patient Active Problem List   Diagnosis Date Noted  . Status post total replacement of right hip 07/19/2017  . Unilateral primary osteoarthritis, right hip 04/18/2017  . Pain of right hip joint 04/18/2017  . Chronic pain of right hip 07/20/2014  . Memory loss 07/20/2014  . Sciatica 01/25/2014  . Essential hypertension 01/25/2014  . Hair thinning 10/16/2013  . Depression 10/13/2013  . Anxiety state, unspecified 10/13/2013  . Leg  cramps 10/13/2013  . Other screening mammogram 10/13/2013   Past Medical History:  Diagnosis Date  . Arthritis   . Complication of anesthesia    the last surgeries Megan [name] has had patient states Megan [name] woke up during surgery  . Depression Dx 2010  . Hypertension Dx 2015   last time meds taken - 2016   . OCD (obsessive compulsive disorder)   . PONV (postoperative nausea and vomiting)   . Tuberculosis    latent TB- 2000 - received treatment     Family History  Problem Relation Age of Onset  . Cancer Mother   . Cancer Father     Past Surgical History:  Procedure Laterality Date  . COLONOSCOPY    . NO PAST SURGERIES    . TOOTH EXTRACTION    . TOTAL HIP ARTHROPLASTY Right 07/19/2017   Procedure: RIGHT TOTAL HIP ARTHROPLASTY ANTERIOR APPROACH;  Surgeon: Kathryne Hitch, MD;  Location: [facility];  Service: Orthopedics;  Laterality: Right;   Social History   Occupational History  . Not on file  Tobacco Use  . Smoking status: Never Smoker  . Smokeless tobacco: Never Used  Substance and Sexual Activity  . Alcohol use: No  . Drug use: No  . Sexual activity: Yes    Birth control/protection: None

## 2019-10-12 NOTE — Addendum Note (Signed)
Addended by: Rogers Seeds on: 10/12/2019 01:01 PM   Modules accepted: Orders

## 2019-11-02 ENCOUNTER — Ambulatory Visit (INDEPENDENT_AMBULATORY_CARE_PROVIDER_SITE_OTHER): Payer: Medicaid Other | Admitting: Advanced Practice Midwife

## 2019-11-02 ENCOUNTER — Other Ambulatory Visit: Payer: Self-pay

## 2019-11-02 ENCOUNTER — Encounter: Payer: Self-pay | Admitting: Advanced Practice Midwife

## 2019-11-02 VITALS — BP 155/84 | HR 65 | Ht 69.0 in | Wt 288.6 lb

## 2019-11-02 DIAGNOSIS — Z01419 Encounter for gynecological examination (general) (routine) without abnormal findings: Secondary | ICD-10-CM

## 2019-11-02 DIAGNOSIS — M79629 Pain in unspecified upper arm: Secondary | ICD-10-CM

## 2019-11-02 DIAGNOSIS — Z0000 Encounter for general adult medical examination without abnormal findings: Secondary | ICD-10-CM | POA: Diagnosis not present

## 2019-11-02 DIAGNOSIS — I10 Essential (primary) hypertension: Secondary | ICD-10-CM

## 2019-11-02 DIAGNOSIS — R87619 Unspecified abnormal cytological findings in specimens from cervix uteri: Secondary | ICD-10-CM

## 2019-11-02 DIAGNOSIS — N3946 Mixed incontinence: Secondary | ICD-10-CM | POA: Diagnosis not present

## 2019-11-02 LAB — POCT URINALYSIS DIPSTICK
Bilirubin, UA: NEGATIVE
Blood, UA: NEGATIVE
Glucose, UA: NEGATIVE
Leukocytes, UA: NEGATIVE
Nitrite, UA: NEGATIVE
Protein, UA: NEGATIVE
Spec Grav, UA: 1.02 (ref 1.010–1.025)
Urobilinogen, UA: 0.2 E.U./dL
pH, UA: 5 (ref 5.0–8.0)

## 2019-11-02 MED ORDER — AMLODIPINE BESYLATE 10 MG PO TABS: 10.0000 mg | ORAL_TABLET | Freq: Every day | ORAL | 3 refills | Status: AC

## 2019-11-02 MED ORDER — PHENAZOPYRIDINE HCL 200 MG PO TABS: 200.0000 mg | ORAL_TABLET | Freq: Three times a day (TID) | ORAL | 1 refills | Status: DC | PRN

## 2019-11-02 NOTE — Progress Notes (Signed)
New patient is in the office for annual Pt referred due to abnormal pap in 2020 at [facility]. Pt reports that she has been leaking urine for the last 3 months on and off. Pt denies pain and odor with urination. LMP 10-06-19

## 2019-11-02 NOTE — Addendum Note (Signed)
Addended by: Natale Milch D on: 11/02/2019 02:08 PM   Modules accepted: Orders

## 2019-11-02 NOTE — Patient Instructions (Signed)
Urinary Incontinence  Urinary incontinence refers to a condition in which a person is unable to control where and when to pass urine. A person with this condition will urinate when he or she does not mean to (involuntarily). What are the causes? This condition may be caused by:  Medicines.  Infections.  Constipation.  Overactive bladder muscles.  Weak bladder muscles.  Weak pelvic floor muscles. These muscles provide support for the bladder, intestine, and, in women, the uterus.  Enlarged prostate in men. The prostate is a gland near the bladder. When it gets too big, it can pinch the urethra. With the urethra blocked, the bladder can weaken and lose the ability to empty properly.  Surgery.  Emotional factors, such as anxiety, stress, or post-traumatic stress disorder (PTSD).  Pelvic organ prolapse. This happens in women when organs shift out of place and into the vagina. This shift can prevent the bladder and urethra from working properly. What increases the risk? The following factors may make you more likely to develop this condition:  Older age.  Obesity and physical inactivity.  Pregnancy and childbirth.  Menopause.  Diseases that affect the nerves or spinal cord (neurological diseases).  Long-term (chronic) coughing. This can increase pressure on the bladder and pelvic floor muscles. What are the signs or symptoms? Symptoms may vary depending on the type of urinary incontinence you have. They include:  A sudden urge to urinate, but passing urine involuntarily before you can get to a bathroom (urge incontinence).  Suddenly passing urine with any activity that forces urine to pass, such as coughing, laughing, exercise, or sneezing (stress incontinence).  Needing to urinate often, but urinating only a small amount, or constantly dribbling urine (overflow incontinence).  Urinating because you cannot get to the bathroom in time due to a physical disability, such as  arthritis or injury, or communication and thinking problems, such as Alzheimer disease (functional incontinence). How is this diagnosed? This condition may be diagnosed based on:  Your medical history.  A physical exam.  Tests, such as: ? Urine tests. ? X-rays of your kidney and bladder. ? Ultrasound. ? CT scan. ? Cystoscopy. In this procedure, a health care provider inserts a tube with a light and camera (cystoscope) through the urethra and into the bladder in order to check for problems. ? Urodynamic testing. These tests assess how well the bladder, urethra, and sphincter can store and release urine. There are different types of urodynamic tests, and they vary depending on what the test is measuring. To help diagnose your condition, your health care provider may recommend that you keep a log of when you urinate and how much you urinate. How is this treated? Treatment for this condition depends on the type of incontinence that you have and its cause. Treatment may include:  Lifestyle changes, such as: ? Quitting smoking. ? Maintaining a healthy weight. ? Staying active. Try to get 150 minutes of moderate-intensity exercise every week. Ask your health care provider which activities are safe for you. ? Eating a healthy diet.  Avoid high-fat foods, like fried foods.  Avoid refined carbohydrates like white bread and white rice.  Limit how much alcohol and caffeine you drink.  Increase your fiber intake. Foods such as fresh fruits, vegetables, beans, and whole grains are healthy sources of fiber.  Pelvic floor muscle exercises.  Bladder training, such as lengthening the amount of time between bathroom breaks, or using the bathroom at regular intervals.  Using techniques to suppress bladder urges.   This can include distraction techniques or controlled breathing exercises.  Medicines to relax the bladder muscles and prevent bladder spasms.  Medicines to help slow or prevent the  growth of a man's prostate.  Botox injections. These can help relax the bladder muscles.  Using pulses of electricity to help change bladder reflexes (electrical nerve stimulation).  For women, using a medical device to prevent urine leaks. This is a small, tampon-like, disposable device that is inserted into the urethra.  Injecting collagen or carbon beads (bulking agents) into the urinary sphincter. These can help thicken tissue and close the bladder opening.  Surgery. Follow these instructions at home: Lifestyle  Limit alcohol and caffeine. These can fill your bladder quickly and irritate it.  Keep yourself clean to help prevent odors and skin damage. Ask your doctor about special skin creams and cleansers that can protect the skin from urine.  Consider wearing pads or adult diapers. Make sure to change them regularly, and always change them right after experiencing incontinence. General instructions  Take over-the-counter and prescription medicines only as told by your health care provider.  Use the bathroom about every 3-4 hours, even if you do not feel the need to urinate. Try to empty your bladder completely every time. After urinating, wait a minute. Then try to urinate again.  Make sure you are in a relaxed position while urinating.  If your incontinence is caused by nerve problems, keep a log of the medicines you take and the times you go to the bathroom.  Keep all follow-up visits as told by your health care provider. This is important. Contact a health care provider if:  You have pain that gets worse.  Your incontinence gets worse. Get help right away if:  You have a fever or chills.  You are unable to urinate.  You have redness in your groin area or down your legs. Summary  Urinary incontinence refers to a condition in which a person is unable to control where and when to pass urine.  This condition may be caused by medicines, infection, weak bladder  muscles, weak pelvic floor muscles, enlargement of the prostate (in men), or surgery.  The following factors increase your risk for developing this condition: older age, obesity, pregnancy and childbirth, menopause, neurological diseases, and chronic coughing.  There are several types of urinary incontinence. They include urge incontinence, stress incontinence, overflow incontinence, and functional incontinence.  This condition is usually treated first with lifestyle and behavioral changes, such as quitting smoking, eating a healthier diet, and doing regular pelvic floor exercises. Other treatment options include medicines, bulking agents, medical devices, electrical nerve stimulation, or surgery. This information is not intended to replace advice given to you by your health care provider. Make sure you discuss any questions you have with your health care provider. Document Revised: 05/03/2017 Document Reviewed: 08/02/2016 Elsevier Patient Education  [employer].   Hypertension, Adult High blood pressure (hypertension) is when the force of blood pumping through the arteries is too strong. The arteries are the blood vessels that carry blood from the heart throughout the body. Hypertension forces the heart to work harder to pump blood and may cause arteries to become narrow or stiff. Untreated or uncontrolled hypertension can cause a heart attack, heart failure, a stroke, kidney disease, and other problems. A blood pressure reading consists of a higher number over a lower number. Ideally, your blood pressure should be below 120/80. The first ("top") number is called the systolic pressure. It is  a measure of the pressure in your arteries as your heart beats. The second ("bottom") number is called the diastolic pressure. It is a measure of the pressure in your arteries as the heart relaxes. What are the causes? The exact cause of this condition is not known. There are some conditions that result  in or are related to high blood pressure. What increases the risk? Some risk factors for high blood pressure are under your control. The following factors may make you more likely to develop this condition:  Smoking.  Having type 2 diabetes mellitus, high cholesterol, or both.  Not getting enough exercise or physical activity.  Being overweight.  Having too much fat, sugar, calories, or salt (sodium) in your diet.  Drinking too much alcohol. Some risk factors for high blood pressure may be difficult or impossible to change. Some of these factors include:  Having chronic kidney disease.  Having a family history of high blood pressure.  Age. Risk increases with age.  Race. You may be at higher risk if you are African American.  Gender. Men are at higher risk than women before age 33. After age 27, women are at higher risk than men.  Having obstructive sleep apnea.  Stress. What are the signs or symptoms? High blood pressure may not cause symptoms. Very high blood pressure (hypertensive crisis) may cause:  Headache.  Anxiety.  Shortness of breath.  Nosebleed.  Nausea and vomiting.  Vision changes.  Severe chest pain.  Seizures. How is this diagnosed? This condition is diagnosed by measuring your blood pressure while you are seated, with your arm resting on a flat surface, your legs uncrossed, and your feet flat on the floor. The cuff of the blood pressure monitor will be placed directly against the skin of your upper arm at the level of your heart. It should be measured at least twice using the same arm. Certain conditions can cause a difference in blood pressure between your right and left arms. Certain factors can cause blood pressure readings to be lower or higher than normal for a short period of time:  When your blood pressure is higher when you are in a health care provider's office than when you are at home, this is called white coat hypertension. Most people  with this condition do not need medicines.  When your blood pressure is higher at home than when you are in a health care provider's office, this is called masked hypertension. Most people with this condition may need medicines to control blood pressure. If you have a high blood pressure reading during one visit or you have normal blood pressure with other risk factors, you may be asked to:  Return on a different day to have your blood pressure checked again.  Monitor your blood pressure at home for 1 week or longer. If you are diagnosed with hypertension, you may have other blood or imaging tests to help your health care provider understand your overall risk for other conditions. How is this treated? This condition is treated by making healthy lifestyle changes, such as eating healthy foods, exercising more, and reducing your alcohol intake. Your health care provider may prescribe medicine if lifestyle changes are not enough to get your blood pressure under control, and if:  Your systolic blood pressure is above 130.  Your diastolic blood pressure is above 80. Your personal target blood pressure may vary depending on your medical conditions, your age, and other factors. Follow these instructions at home: Eating and  drinking   Eat a diet that is high in fiber and potassium, and low in sodium, added sugar, and fat. An example eating plan is called the DASH (Dietary Approaches to Stop Hypertension) diet. To eat this way: ? Eat plenty of fresh fruits and vegetables. Try to fill one half of your plate at each meal with fruits and vegetables. ? Eat whole grains, such as whole-wheat pasta, brown rice, or whole-grain bread. Fill about one fourth of your plate with whole grains. ? Eat or drink low-fat dairy products, such as skim milk or low-fat yogurt. ? Avoid fatty cuts of meat, processed or cured meats, and poultry with skin. Fill about one fourth of your plate with lean proteins, such as fish,  chicken without skin, beans, eggs, or tofu. ? Avoid pre-made and processed foods. These tend to be higher in sodium, added sugar, and fat.  Reduce your daily sodium intake. Most people with hypertension should eat less than 1,500 mg of sodium a day.  Do not drink alcohol if: ? Your health care provider tells you not to drink. ? You are pregnant, may be pregnant, or are planning to become pregnant.  If you drink alcohol: ? Limit how much you use to:  0-1 drink a day for women.  0-2 drinks a day for men. ? Be aware of how much alcohol is in your drink. In the [place] one drink equals one 12 oz bottle of beer (355 mL), one 5 oz glass of wine (148 mL), or one 1 oz glass of hard liquor (44 mL). Lifestyle   Work with your health care provider to maintain a healthy body weight or to lose weight. Ask what an ideal weight is for you.  Get at least 30 minutes of exercise most days of the week. Activities may include walking, swimming, or biking.  Include exercise to strengthen your muscles (resistance exercise), such as Pilates or lifting weights, as part of your weekly exercise routine. Try to do these types of exercises for 30 minutes at least 3 days a week.  Do not use any products that contain nicotine or tobacco, such as cigarettes, e-cigarettes, and chewing tobacco. If you need help quitting, ask your health care provider.  Monitor your blood pressure at home as told by your health care provider.  Keep all follow-up visits as told by your health care provider. This is important. Medicines  Take over-the-counter and prescription medicines only as told by your health care provider. Follow directions carefully. Blood pressure medicines must be taken as prescribed.  Do not skip doses of blood pressure medicine. Doing this puts you at risk for problems and can make the medicine less effective.  Ask your health care provider about side effects or reactions to medicines that you should watch  for. Contact a health care provider if you:  Think you are having a reaction to a medicine you are taking.  Have headaches that keep coming back (recurring).  Feel dizzy.  Have swelling in your ankles.  Have trouble with your vision. Get help right away if you:  Develop a severe headache or confusion.  Have unusual weakness or numbness.  Feel faint.  Have severe pain in your chest or abdomen.  Vomit repeatedly.  Have trouble breathing. Summary  Hypertension is when the force of blood pumping through your arteries is too strong. If this condition is not controlled, it may put you at risk for serious complications.  Your personal target blood pressure may vary  depending on your medical conditions, your age, and other factors. For most people, a normal blood pressure is less than 120/80.  Hypertension is treated with lifestyle changes, medicines, or a combination of both. Lifestyle changes include losing weight, eating a healthy, low-sodium diet, exercising more, and limiting alcohol. This information is not intended to replace advice given to you by your health care provider. Make sure you discuss any questions you have with your health care provider. Document Revised: 01/01/2018 Document Reviewed: 01/01/2018 Elsevier Patient Education  2020 Elsevier Inc.  

## 2019-11-02 NOTE — Progress Notes (Signed)
Subjective:     Megan [name] is a 49 y.o. female here at [facility] for a routine exam.  Current complaints: swelling pain of both axilla, urine leakage with cough/sneeze/urgency, and sometimes without cause.  Personal health questionnaire reviewed: yes.  Do you have a primary care provider? yes Do you feel safe at home? yes Has anyone hit, slapped, or kicked you recently? no Do you feel sad, tired, or upset most days or are you mostly happy with life? happy most days    Gynecologic History Patient's last menstrual period was 10/06/2019. Contraception: none Last Pap: 2020. Results were: abnormal, unsure of exact results per pt Last mammogram: 2020 per pt. Results were: normal  Obstetric History OB History  Gravida Para Term Preterm AB Living  9 3     6 3   SAB TAB Ectopic Multiple Live Births    6          # Outcome Date GA Lbr Len/2nd Weight Sex Delivery Anes PTL Lv  9 TAB           8 TAB           7 TAB           6 TAB           5 TAB           4 TAB           3 Para           2 Para           1 Para              The following portions of the patient's history were reviewed and updated as appropriate: allergies, current medications, past family history, past medical history, past social history, past surgical history and problem list.  Review of Systems Pertinent items noted in HPI and remainder of comprehensive ROS otherwise negative.    Objective:   BP (!) 155/84   Pulse 65   Ht 5\' 9"  (1.753 m)   Wt 288 lb 9.6 oz (130.9 kg)   LMP 10/06/2019   BMI 42.62 kg/m   VS reviewed, nursing note reviewed,  Constitutional: well developed, well nourished, no distress HEENT: normocephalic CV: normal rate Pulm/chest wall: normal effort Breast Exam:  right breast normal without mass, skin or nipple changes or axillary nodes, left breast normal without mass, skin or nipple changes or axillary nodes Abdomen: soft Neuro: alert and oriented x 3 Skin: warm, dry Psych: affect  normal Pelvic exam: Deferred Bimanual exam: Cervix 0/long/high, firm, anterior, neg CMT, uterus nontender, nonenlarged, adnexa without tenderness, enlargement, or mass     Assessment/Plan:   1. Mixed stress and urge urinary incontinence --Pt with recent urinary incontinence, symptoms starting 2-3 months ago.  No  Changes in dietary habits, she does report 40 lb weight gain in last several months.  No hx incontinence before now. --Pelvic exam wnl today --Try Pyridum for PRN use, pt has referral to Urology from PCP. - phenazopyridine (PYRIDIUM) 200 MG tablet; Take 1 tablet (200 mg total) by mouth 3 (three) times daily as needed for pain (urethral spasm).  Dispense: 10 tablet; Refill: 1  2. Benign essential hypertension --Pt reports she has dx HTN but has not taken medication that was prescribed months ago.  BP elevated today. Discussed recommendation to keep BP wnl, risks of HTN over time with pt who states understanding. Pt unsure of her current Rx. Will start calcium channel  blocker daily today, pt to f/u with PCP. - amLODipine (NORVASC) 10 MG tablet; Take 1 tablet (10 mg total) by mouth daily.  Dispense: 30 tablet; Refill: 3  3. Well woman exam with routine gynecological exam --Pt with monthly menses, lasting 5 days, moderate in flow. No dysmenorrhea but some ovulatory pain in between periods that resolves spontaneously.   --Pap deferred today bc Pt reports her previous provider recommended colposcopy. Pt is unsure of the exact results of her abnormal pap or hpv status. --Records requested to review pap results from 2020  4. Abnormal cervical Papanicolaou smear, unspecified abnormal pap finding --See above   5. Pain in axilla, unspecified laterality --Exam with swelling/fluid filled cyst bilaterally in axilla at location of pt tenderness.  Likely benign, warm compresses, ice for discomfort recommended and pt to f/u with PCP or dermatology as may need I&D if worsen or persist.   --Breast  tissue wnl, no abnormal masses palpated.    Follow up dependent upon pap results, see above.   Sharen Counter, CNM 11:49 AM

## 2019-11-03 LAB — URINALYSIS
Bilirubin, UA: NEGATIVE
Glucose, UA: NEGATIVE
Ketones, UA: NEGATIVE
Leukocytes,UA: NEGATIVE
Nitrite, UA: NEGATIVE
Protein,UA: NEGATIVE
RBC, UA: NEGATIVE
Specific Gravity, UA: 1.025 (ref 1.005–1.030)
Urobilinogen, Ur: 0.2 mg/dL (ref 0.2–1.0)
pH, UA: 5.5 (ref 5.0–7.5)

## 2019-11-04 LAB — URINE CULTURE: Organism ID, Bacteria: NO GROWTH

## 2019-11-12 ENCOUNTER — Encounter: Payer: Self-pay | Admitting: Plastic Surgery

## 2019-11-12 ENCOUNTER — Other Ambulatory Visit: Payer: Self-pay

## 2019-11-12 ENCOUNTER — Ambulatory Visit (INDEPENDENT_AMBULATORY_CARE_PROVIDER_SITE_OTHER): Payer: Medicaid Other | Admitting: Plastic Surgery

## 2019-11-12 VITALS — BP 149/85 | HR 78 | Ht 69.0 in | Wt 288.8 lb

## 2019-11-12 DIAGNOSIS — M4004 Postural kyphosis, thoracic region: Secondary | ICD-10-CM

## 2019-11-12 DIAGNOSIS — N62 Hypertrophy of breast: Secondary | ICD-10-CM | POA: Diagnosis not present

## 2019-11-12 DIAGNOSIS — M546 Pain in thoracic spine: Secondary | ICD-10-CM

## 2019-11-12 DIAGNOSIS — M545 Low back pain, unspecified: Secondary | ICD-10-CM

## 2019-11-12 NOTE — Progress Notes (Signed)
Referring Provider Fleet Contras, MD [address] [place],  [place] [address]   CC:  Chief Complaint  Patient presents with   Consult      Megan [name] is an 49 y.o. female.  HPI: Patient presents to discuss back pain.  She is interested in a breast reduction.  She has had years of back pain, neck pain, shoulder grooving from her large breasts.  She is currently larger than a triple D and wants to be around a C.  She gets rashes beneath her breast that are refractory to conservative topical treatments.  She is not a diabetic and does not smoke.  She has not had any previous breast procedures.  Her last mammogram was in 2015 and was fine.  For her pain she is tried over-the-counter medications, warm packs, cold packs with little relief.  Allergies  Allergen Reactions   Shellfish Allergy Anaphylaxis   Latex Rash    Outpatient Encounter Medications as of 11/12/2019  Medication Sig   amLODipine (NORVASC) 10 MG tablet Take 1 tablet (10 mg total) by mouth daily.   aspirin EC 325 MG EC tablet Take 1 tablet (325 mg total) by mouth 2 (two) times daily after a meal.   FLUoxetine (PROZAC) 40 MG capsule Take 40 mg by mouth daily.   gabapentin (NEURONTIN) 300 MG capsule Take 300 mg by mouth 3 (three) times daily.   HYDROcodone-acetaminophen (NORCO) 10-325 MG tablet Take 1 tablet by mouth 2 (two) times daily as needed.   meloxicam (MOBIC) 15 MG tablet Take 15 mg by mouth daily.   methocarbamol (ROBAXIN) 500 MG tablet Take 1 tablet (500 mg total) by mouth every 6 (six) hours as needed for muscle spasms.   Oxcarbazepine (TRILEPTAL) 300 MG tablet Take 300 mg by mouth 2 (two) times daily.   phenazopyridine (PYRIDIUM) 200 MG tablet Take 1 tablet (200 mg total) by mouth 3 (three) times daily as needed for pain (urethral spasm).   Vitamin D, Ergocalciferol, (DRISDOL) 50000 units CAPS capsule Take 50,000 Units by mouth every Thursday. IN THE MORNING.   oxyCODONE (OXY IR/ROXICODONE) 5  MG immediate release tablet Take 1-2 tablets (5-10 mg total) by mouth every 6 (six) hours as needed for moderate pain (pain score 4-6). (Patient not taking: Reported on 11/12/2019)   No facility-administered encounter medications on file as of 11/12/2019.     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     Past Surgical History:  Procedure Laterality Date   COLONOSCOPY     NO PAST SURGERIES     TOOTH EXTRACTION     TOTAL HIP ARTHROPLASTY Right 07/19/2017   Procedure: RIGHT TOTAL HIP ARTHROPLASTY ANTERIOR APPROACH;  Surgeon: Kathryne Hitch, MD;  Location: [facility];  Service: Orthopedics;  Laterality: Right;    Family History  Problem Relation Age of Onset   Cancer Mother    Cancer Father     Social History   Social History Narrative   Not on file  Denies tobacco use  Review of Systems General: Denies fevers, chills, weight loss CV: Denies chest pain, shortness  of breath, palpitations  Physical Exam Vitals with BMI 11/12/2019 11/02/2019 10/12/2019  Height 5\' 9"  5\' 9"  5\' 8"   Weight 288 lbs 13 oz 288 lbs 10 oz 280 lbs  BMI 42.63 42.6 42.58  Systolic 149 155 -  Diastolic 85 84 -  Pulse 78 65 -    General:  No acute distress,  Alert and oriented, Non-Toxic, Normal speech and affect Breast: She has grade 3 ptosis.  Sternal notch to nipple is 37 cm bilaterally.  Nipple to fold is 19 cm on the right and 18 cm on the left.  She has a left upper chest scar that she says is from a bee sting and is quite thick.  I do not see any other obvious scars or masses.  Assessment/Plan The patient has bilateral symptomatic macromastia.  She is a good  candidate for a breast reduction.  She is interested in pursuing surgical treatment.  The details of breast reduction surgery were discussed.  I explained the procedure in detail along the with the expected scars.  The risks were discussed in detail and include bleeding, infection, damage to surrounding structures, need for additional procedures, nipple loss, change in nipple sensation, persistent pain, contour irregularities and asymmetries.  I explained that breast feeding is often not possible after breast reduction surgery.  We discussed the expected postoperative course with an overall recovery period of about 1 month.  She demonstrated full understanding of all risks.  We discussed her personal risk factors that include potential for keloid scars.  She has a few thick scars from various surgical procedures.  She does have a recent hip replacement scar that did not keloid but it is a little bit thick.  We discussed the fact that her breast reduction scars would be a bit unpredictable and she is fully understanding of that.  She currently gets chronic narcotic pain medication.  I explained that we would not be able to prescribe narcotics if she is currently getting them from another provider.  We would have to coordinate around her surgery if she chooses to move forward.  I anticipate approximately 1000 g of tissue removed from each side.   11/12/2019, 9:46 AM

## 2019-12-08 ENCOUNTER — Telehealth: Payer: Self-pay

## 2019-12-08 NOTE — Telephone Encounter (Signed)
TC to pt to make aware she will need colpo Pt discussed with Misty Stanley at last appt possibility of colpo per notes Pt not ava LVM.

## 2019-12-08 NOTE — Telephone Encounter (Signed)
Patient returned previous call, advised the need for colpo, transferred to scheduler.

## 2019-12-17 ENCOUNTER — Other Ambulatory Visit: Payer: Self-pay | Admitting: Surgical

## 2019-12-17 DIAGNOSIS — Z1231 Encounter for screening mammogram for malignant neoplasm of breast: Secondary | ICD-10-CM

## 2019-12-20 NOTE — Progress Notes (Signed)
ICD-10-CM   1. Macromastia  N62       Patient ID: Megan [name], female    DOB: Sep 26, 1970, 49 y.o.   MRN: [id number]   History of Present Illness: Megan [name] is a 49 y.o.  female  with a history of macromastia.  She presents for preoperative evaluation for upcoming procedure, bilateral breast reduction, scheduled for 01/05/2020 with Dr. Arita Miss.  Summary from previous visit: Patient has had years of back pain, neck pain, and shoulder grooving from her large breast and is interested in a breast reduction.  She is currently larger than a DDD and would like to be around a C.  She has grade 3 ptosis.  Sternal notch to nipple distance is 37 cm bilaterally.  Nipple to IMF is 19 cm on the right and 18 cm on the left.  She has a left upper chest scar that she says is from a bee sting that is quite thick.  No other obvious scars or masses.  She has a recent hip replacement scar that did not keloid but is a bit thick.  Dr. Arita Miss discussed with her the fact that her breast reduction scars would be a bit unpredictable and she is fully understanding of that.  She is currently on chronic narcotic pain medication management.  Anticipated excess breast tissue to be removed at time of surgery is 1000 g from each side.  Job: N/A  PMH Significant for: Depression, OCD, latent TB (received treatment 2000), HTN, hip replacement  The patient has had problems with anesthesia. PONV  Past Medical History: Allergies: Allergies  Allergen Reactions  . Shellfish Allergy Anaphylaxis  . Latex Rash    Current Medications:  Current Outpatient Medications:  .  amLODipine (NORVASC) 10 MG tablet, Take 1 tablet (10 mg total) by mouth daily., Disp: 30 tablet, Rfl: 3 .  aspirin EC 325 MG EC tablet, Take 1 tablet (325 mg total) by mouth 2 (two) times daily after a meal., Disp: 35 tablet, Rfl: 0 .  FLUoxetine (PROZAC) 40 MG capsule, Take 40 mg by mouth daily., Disp: , Rfl:  .  gabapentin (NEURONTIN) 300 MG capsule, Take  300 mg by mouth 3 (three) times daily., Disp: , Rfl:  .  HYDROcodone-acetaminophen (NORCO) 10-325 MG tablet, Take 1 tablet by mouth 2 (two) times daily as needed., Disp: , Rfl:  .  meloxicam (MOBIC) 15 MG tablet, Take 15 mg by mouth daily., Disp: , Rfl:  .  methocarbamol (ROBAXIN) 500 MG tablet, Take 1 tablet (500 mg total) by mouth every 6 (six) hours as needed for muscle spasms., Disp: 40 tablet, Rfl: 1 .  Oxcarbazepine (TRILEPTAL) 300 MG tablet, Take 300 mg by mouth 2 (two) times daily., Disp: , Rfl:  .  oxyCODONE (OXY IR/ROXICODONE) 5 MG immediate release tablet, Take 1-2 tablets (5-10 mg total) by mouth every 6 (six) hours as needed for moderate pain (pain score 4-6)., Disp: 40 tablet, Rfl: 0 .  phenazopyridine (PYRIDIUM) 200 MG tablet, Take 1 tablet (200 mg total) by mouth 3 (three) times daily as needed for pain (urethral spasm)., Disp: 10 tablet, Rfl: 1 .  Vitamin D, Ergocalciferol, (DRISDOL) 50000 units CAPS capsule, Take 50,000 Units by mouth every Thursday. IN THE MORNING., Disp: , Rfl:  .  acetaminophen (TYLENOL) 500 MG tablet, Take 1 tablet (500 mg total) by mouth every 6 (six) hours as needed. For use AFTER surgery, Disp: 30 tablet, Rfl: 0 .  ondansetron (ZOFRAN) 4 MG tablet, Take 1  tablet (4 mg total) by mouth every 8 (eight) hours as needed for nausea or vomiting., Disp: 20 tablet, Rfl: 0  Past Medical Problems: Past Medical History:  Diagnosis Date  . Arthritis   . Complication of anesthesia    the last surgeries she has had patient states she woke up during surgery  . Depression Dx 2010  . Hypertension Dx 2015   last time meds taken - 2016   . OCD (obsessive compulsive disorder)   . PONV (postoperative nausea and vomiting)   . Tuberculosis    latent TB- 2000 - received treatment     Past Surgical History: Past Surgical History:  Procedure Laterality Date  . COLONOSCOPY    . NO PAST SURGERIES    . TOOTH EXTRACTION    . TOTAL HIP ARTHROPLASTY Right 07/19/2017    Procedure: RIGHT TOTAL HIP ARTHROPLASTY ANTERIOR APPROACH;  Surgeon: Kathryne Hitch, MD;  Location: [facility];  Service: Orthopedics;  Laterality: Right;    Social History: Social History   Socioeconomic History  . Marital status: Single    Spouse name: Not on file  . Number of children: Not on file  . Years of education: Not on file  . Highest education level: Not on file  Occupational History  . Not on file  Tobacco Use  . Smoking status: Never Smoker  . Smokeless tobacco: Never Used  Vaping Use  . Vaping Use: Never used  Substance and Sexual Activity  . Alcohol use: No  . Drug use: No  . Sexual activity: Yes    Birth control/protection: None  Other Topics Concern  . Not on file  Social History Narrative  . Not on file   Social Determinants of Health   Financial Resource Strain:   . Difficulty of Paying Living Expenses:   Food Insecurity:   . Worried About Programme researcher, broadcasting/film/video in the Last Year:   . Barista in the Last Year:   Transportation Needs:   . Freight forwarder (Medical):   Marland Kitchen Lack of Transportation (Non-Medical):   Physical Activity:   . Days of Exercise per Week:   . Minutes of Exercise per Session:   Stress:   . Feeling of Stress :   Social Connections:   . Frequency of Communication with Friends and Family:   . Frequency of Social Gatherings with Friends and Family:   . Attends Religious Services:   . Active Member of Clubs or Organizations:   . Attends Banker Meetings:   Marland Kitchen Marital Status:   Intimate Partner Violence:   . Fear of Current or Ex-Partner:   . Emotionally Abused:   Marland Kitchen Physically Abused:   . Sexually Abused:     Family History: Family History  Problem Relation Age of Onset  . Cancer Mother   . Cancer Father     Review of Systems: Review of Systems  Constitutional: Negative for chills and fever.  HENT: Negative for congestion and sore throat.   Respiratory: Negative for cough and shortness of  breath.   Cardiovascular: Negative for chest pain and palpitations.  Gastrointestinal: Negative for abdominal pain, nausea and vomiting.  Musculoskeletal: Positive for back pain and neck pain. Negative for joint pain and myalgias.  Skin: Negative for itching and rash.    Physical Exam: Vital Signs BP 134/76 (BP Location: Left Arm, Patient Position: Sitting, Cuff Size: Normal)   Pulse 68   Temp 98.7 F (37.1 C) (Oral)   Ht  5\' 9"  (1.753 m)   Wt 288 lb 12.8 oz (131 kg)   SpO2 99%   BMI 42.65 kg/m  Physical Exam Vitals and nursing note reviewed.  Constitutional:      General: She is not in acute distress.    Appearance: Normal appearance. She is obese. She is not ill-appearing.  HENT:     Head: Normocephalic and atraumatic.  Eyes:     Extraocular Movements: Extraocular movements intact.  Cardiovascular:     Rate and Rhythm: Normal rate and regular rhythm.     Pulses: Normal pulses.     Heart sounds: Normal heart sounds.  Pulmonary:     Effort: Pulmonary effort is normal.     Breath sounds: Normal breath sounds. No wheezing, rhonchi or rales.  Abdominal:     General: Bowel sounds are normal.     Palpations: Abdomen is soft.  Musculoskeletal:        General: No swelling. Normal range of motion.     Cervical back: Normal range of motion.  Skin:    General: Skin is warm and dry.     Coloration: Skin is not pale.     Findings: No erythema or rash.  Neurological:     General: No focal deficit present.     Mental Status: She is alert and oriented to person, place, and time.  Psychiatric:        Mood and Affect: Mood normal.        Behavior: Behavior normal.        Thought Content: Thought content normal.        Judgment: Judgment normal.     Assessment/Plan:  Megan [name] scheduled for bilateral breast reduction with Dr. Arita MissPace.  Risks, benefits, and alternatives of procedure discussed, questions answered and consent obtained.    Smoking Status: Non-smoker; Counseling  Given? N/A Last Mammogram: 12/21/2019; Results: pending results  Caprini Score: 4 Moderate ; Risk Factors include: 49 year old female, BMI > 25, and length of planned surgery. Recommendation for mechanical or pharmacological prophylaxis during surgery. Encourage early ambulation.   Pictures obtained: 11/12/2019  Post-op Rx sent to pharmacy: Zofran, Tylenol 500 mg Patient is on chronic narcotic pain management. Will hold off on providing additional pain medication unless Pain Mgmt wants us to order additional meds. Patient will speak with Pain management doctor and they will let us know.  Patient was provided with the breast reduction risks and General Surgical Risk consent document and Pain Medication Agreement prior to their appointment.  They had adequate time to read through the risk consent documents and Pain Medication Agreement. We also discussed them in person together during this preop appointment. All of their questions were answered to their satisfaction.  Recommended calling if they have any further questions.  Risk consent form and Pain Medication Agreement to be scanned into patient's chart.  The risk that can be encountered with breast reduction were discussed and include the following but not limited to these:  Breast asymmetry, fluid accumulation, firmness of the breast, inability to breast feed, loss of nipple or areola, skin loss, decrease or no nipple sensation, fat necrosis of the breast tissue, bleeding, infection, healing delay.  There are risks of anesthesia, changes to skin sensation and injury to nerves or blood vessels.  The muscle can be temporarily or permanently injured.  You may have an allergic reaction to tape, suture, glue, blood products which can result in skin discoloration, swelling, pain, skin lesions, poor healing.  Any of these  can lead to the need for revisonal surgery or stage procedures.  A reduction has potential to interfere with diagnostic procedures.  Nipple or  breast piercing can increase risks of infection.  This procedure is best done when the breast is fully developed.  Changes in the breast will continue to occur over time.  Pregnancy can alter the outcomes of previous breast reduction surgery, weight gain and weigh loss can also effect the long term appearance.   The 21st Century Cures Act was signed into law in 2016 which includes the topic of electronic health records.  This provides immediate access to information in MyChart.  This includes consultation notes, operative notes, office notes, lab results and pathology reports.  If you have any questions about what you read please let us know at your next visit or call us at the office.  We are right here with you.   Electronically signed by: Eldridge Abrahams, PA-C 12/21/2019 2:25 PM

## 2019-12-20 NOTE — H&P (View-Only) (Signed)
ICD-10-CM   1. Macromastia  N62       Patient ID: Megan [name], female    DOB: Sep 26, 1970, 49 y.o.   MRN: [id number]   History of Present Illness: Megan [name] is a 49 y.o.  female  with a history of macromastia.  She presents for preoperative evaluation for upcoming procedure, bilateral breast reduction, scheduled for 01/05/2020 with Dr. Arita Miss.  Summary from previous visit: Patient has had years of back pain, neck pain, and shoulder grooving from her large breast and is interested in a breast reduction.  She is currently larger than a DDD and would like to be around a C.  She has grade 3 ptosis.  Sternal notch to nipple distance is 37 cm bilaterally.  Nipple to IMF is 19 cm on the right and 18 cm on the left.  She has a left upper chest scar that she says is from a bee sting that is quite thick.  No other obvious scars or masses.  She has a recent hip replacement scar that did not keloid but is a bit thick.  Dr. Arita Miss discussed with her the fact that her breast reduction scars would be a bit unpredictable and she is fully understanding of that.  She is currently on chronic narcotic pain medication management.  Anticipated excess breast tissue to be removed at time of surgery is 1000 g from each side.  Job: N/A  PMH Significant for: Depression, OCD, latent TB (received treatment 2000), HTN, hip replacement  The patient has had problems with anesthesia. PONV  Past Medical History: Allergies: Allergies  Allergen Reactions  . Shellfish Allergy Anaphylaxis  . Latex Rash    Current Medications:  Current Outpatient Medications:  .  amLODipine (NORVASC) 10 MG tablet, Take 1 tablet (10 mg total) by mouth daily., Disp: 30 tablet, Rfl: 3 .  aspirin EC 325 MG EC tablet, Take 1 tablet (325 mg total) by mouth 2 (two) times daily after a meal., Disp: 35 tablet, Rfl: 0 .  FLUoxetine (PROZAC) 40 MG capsule, Take 40 mg by mouth daily., Disp: , Rfl:  .  gabapentin (NEURONTIN) 300 MG capsule, Take  300 mg by mouth 3 (three) times daily., Disp: , Rfl:  .  HYDROcodone-acetaminophen (NORCO) 10-325 MG tablet, Take 1 tablet by mouth 2 (two) times daily as needed., Disp: , Rfl:  .  meloxicam (MOBIC) 15 MG tablet, Take 15 mg by mouth daily., Disp: , Rfl:  .  methocarbamol (ROBAXIN) 500 MG tablet, Take 1 tablet (500 mg total) by mouth every 6 (six) hours as needed for muscle spasms., Disp: 40 tablet, Rfl: 1 .  Oxcarbazepine (TRILEPTAL) 300 MG tablet, Take 300 mg by mouth 2 (two) times daily., Disp: , Rfl:  .  oxyCODONE (OXY IR/ROXICODONE) 5 MG immediate release tablet, Take 1-2 tablets (5-10 mg total) by mouth every 6 (six) hours as needed for moderate pain (pain score 4-6)., Disp: 40 tablet, Rfl: 0 .  phenazopyridine (PYRIDIUM) 200 MG tablet, Take 1 tablet (200 mg total) by mouth 3 (three) times daily as needed for pain (urethral spasm)., Disp: 10 tablet, Rfl: 1 .  Vitamin D, Ergocalciferol, (DRISDOL) 50000 units CAPS capsule, Take 50,000 Units by mouth every Thursday. IN THE MORNING., Disp: , Rfl:  .  acetaminophen (TYLENOL) 500 MG tablet, Take 1 tablet (500 mg total) by mouth every 6 (six) hours as needed. For use AFTER surgery, Disp: 30 tablet, Rfl: 0 .  ondansetron (ZOFRAN) 4 MG tablet, Take 1  tablet (4 mg total) by mouth every 8 (eight) hours as needed for nausea or vomiting., Disp: 20 tablet, Rfl: 0  Past Medical Problems: Past Medical History:  Diagnosis Date  . Arthritis   . Complication of anesthesia    the last surgeries she has had patient states she woke up during surgery  . Depression Dx 2010  . Hypertension Dx 2015   last time meds taken - 2016   . OCD (obsessive compulsive disorder)   . PONV (postoperative nausea and vomiting)   . Tuberculosis    latent TB- 2000 - received treatment     Past Surgical History: Past Surgical History:  Procedure Laterality Date  . COLONOSCOPY    . NO PAST SURGERIES    . TOOTH EXTRACTION    . TOTAL HIP ARTHROPLASTY Right 07/19/2017    Procedure: RIGHT TOTAL HIP ARTHROPLASTY ANTERIOR APPROACH;  Surgeon: Kathryne Hitch, MD;  Location: [facility];  Service: Orthopedics;  Laterality: Right;    Social History: Social History   Socioeconomic History  . Marital status: Single    Spouse name: Not on file  . Number of children: Not on file  . Years of education: Not on file  . Highest education level: Not on file  Occupational History  . Not on file  Tobacco Use  . Smoking status: Never Smoker  . Smokeless tobacco: Never Used  Vaping Use  . Vaping Use: Never used  Substance and Sexual Activity  . Alcohol use: No  . Drug use: No  . Sexual activity: Yes    Birth control/protection: None  Other Topics Concern  . Not on file  Social History Narrative  . Not on file   Social Determinants of Health   Financial Resource Strain:   . Difficulty of Paying Living Expenses:   Food Insecurity:   . Worried About Programme researcher, broadcasting/film/video in the Last Year:   . Barista in the Last Year:   Transportation Needs:   . Freight forwarder (Medical):   Marland Kitchen Lack of Transportation (Non-Medical):   Physical Activity:   . Days of Exercise per Week:   . Minutes of Exercise per Session:   Stress:   . Feeling of Stress :   Social Connections:   . Frequency of Communication with Friends and Family:   . Frequency of Social Gatherings with Friends and Family:   . Attends Religious Services:   . Active Member of Clubs or Organizations:   . Attends Banker Meetings:   Marland Kitchen Marital Status:   Intimate Partner Violence:   . Fear of Current or Ex-Partner:   . Emotionally Abused:   Marland Kitchen Physically Abused:   . Sexually Abused:     Family History: Family History  Problem Relation Age of Onset  . Cancer Mother   . Cancer Father     Review of Systems: Review of Systems  Constitutional: Negative for chills and fever.  HENT: Negative for congestion and sore throat.   Respiratory: Negative for cough and shortness of  breath.   Cardiovascular: Negative for chest pain and palpitations.  Gastrointestinal: Negative for abdominal pain, nausea and vomiting.  Musculoskeletal: Positive for back pain and neck pain. Negative for joint pain and myalgias.  Skin: Negative for itching and rash.    Physical Exam: Vital Signs BP 134/76 (BP Location: Left Arm, Patient Position: Sitting, Cuff Size: Normal)   Pulse 68   Temp 98.7 F (37.1 C) (Oral)   Ht  5' 9" (1.753 m)   Wt 288 lb 12.8 oz (131 kg)   SpO2 99%   BMI 42.65 kg/m  Physical Exam Vitals and nursing note reviewed.  Constitutional:      General: She is not in acute distress.    Appearance: Normal appearance. She is obese. She is not ill-appearing.  HENT:     Head: Normocephalic and atraumatic.  Eyes:     Extraocular Movements: Extraocular movements intact.  Cardiovascular:     Rate and Rhythm: Normal rate and regular rhythm.     Pulses: Normal pulses.     Heart sounds: Normal heart sounds.  Pulmonary:     Effort: Pulmonary effort is normal.     Breath sounds: Normal breath sounds. No wheezing, rhonchi or rales.  Abdominal:     General: Bowel sounds are normal.     Palpations: Abdomen is soft.  Musculoskeletal:        General: No swelling. Normal range of motion.     Cervical back: Normal range of motion.  Skin:    General: Skin is warm and dry.     Coloration: Skin is not pale.     Findings: No erythema or rash.  Neurological:     General: No focal deficit present.     Mental Status: She is alert and oriented to person, place, and time.  Psychiatric:        Mood and Affect: Mood normal.        Behavior: Behavior normal.        Thought Content: Thought content normal.        Judgment: Judgment normal.     Assessment/Plan:  Ms. Torry scheduled for bilateral breast reduction with Dr. Pace.  Risks, benefits, and alternatives of procedure discussed, questions answered and consent obtained.    Smoking Status: Non-smoker; Counseling  Given? N/A Last Mammogram: 12/21/2019; Results: pending results  Caprini Score: 4 Moderate ; Risk Factors include: 49-year-old female, BMI > 25, and length of planned surgery. Recommendation for mechanical or pharmacological prophylaxis during surgery. Encourage early ambulation.   Pictures obtained: 11/12/2019  Post-op Rx sent to pharmacy: Zofran, Tylenol 500 mg Patient is on chronic narcotic pain management. Will hold off on providing additional pain medication unless Pain Mgmt wants us to order additional meds. Patient will speak with Pain management doctor and they will let us know.  Patient was provided with the breast reduction risks and General Surgical Risk consent document and Pain Medication Agreement prior to their appointment.  They had adequate time to read through the risk consent documents and Pain Medication Agreement. We also discussed them in person together during this preop appointment. All of their questions were answered to their satisfaction.  Recommended calling if they have any further questions.  Risk consent form and Pain Medication Agreement to be scanned into patient's chart.  The risk that can be encountered with breast reduction were discussed and include the following but not limited to these:  Breast asymmetry, fluid accumulation, firmness of the breast, inability to breast feed, loss of nipple or areola, skin loss, decrease or no nipple sensation, fat necrosis of the breast tissue, bleeding, infection, healing delay.  There are risks of anesthesia, changes to skin sensation and injury to nerves or blood vessels.  The muscle can be temporarily or permanently injured.  You may have an allergic reaction to tape, suture, glue, blood products which can result in skin discoloration, swelling, pain, skin lesions, poor healing.  Any of these   can lead to the need for revisonal surgery or stage procedures.  A reduction has potential to interfere with diagnostic procedures.  Nipple or  breast piercing can increase risks of infection.  This procedure is best done when the breast is fully developed.  Changes in the breast will continue to occur over time.  Pregnancy can alter the outcomes of previous breast reduction surgery, weight gain and weigh loss can also effect the long term appearance.   The 21st Century Cures Act was signed into law in 2016 which includes the topic of electronic health records.  This provides immediate access to information in MyChart.  This includes consultation notes, operative notes, office notes, lab results and pathology reports.  If you have any questions about what you read please let us know at your next visit or call us at the office.  We are right here with you.   Electronically signed by: Eldridge Abrahams, PA-C 12/21/2019 2:25 PM

## 2019-12-21 ENCOUNTER — Ambulatory Visit (INDEPENDENT_AMBULATORY_CARE_PROVIDER_SITE_OTHER): Payer: Medicaid Other | Admitting: Plastic Surgery

## 2019-12-21 ENCOUNTER — Other Ambulatory Visit: Payer: Self-pay

## 2019-12-21 ENCOUNTER — Ambulatory Visit
Admission: RE | Admit: 2019-12-21 | Discharge: 2019-12-21 | Disposition: A | Discharge status: Home / Self Care | Payer: Medicaid Other | Source: Ambulatory Visit | Attending: Surgical | Admitting: Surgical

## 2019-12-21 ENCOUNTER — Encounter: Payer: Self-pay | Admitting: Plastic Surgery

## 2019-12-21 VITALS — BP 134/76 | HR 68 | Temp 98.7°F | Ht 69.0 in | Wt 288.8 lb

## 2019-12-21 DIAGNOSIS — N62 Hypertrophy of breast: Secondary | ICD-10-CM

## 2019-12-21 DIAGNOSIS — Z1231 Encounter for screening mammogram for malignant neoplasm of breast: Secondary | ICD-10-CM

## 2019-12-21 MED ORDER — ACETAMINOPHEN 500 MG PO TABS: 500.0000 mg | ORAL_TABLET | Freq: Four times a day (QID) | ORAL | 0 refills | Status: AC | PRN

## 2019-12-21 MED ORDER — ONDANSETRON HCL 4 MG PO TABS: 4.0000 mg | ORAL_TABLET | Freq: Three times a day (TID) | ORAL | 0 refills | Status: AC | PRN

## 2019-12-28 ENCOUNTER — Encounter (HOSPITAL_BASED_OUTPATIENT_CLINIC_OR_DEPARTMENT_OTHER): Payer: Self-pay | Admitting: Plastic Surgery

## 2019-12-28 NOTE — Progress Notes (Signed)
Pre op call complete. Pt instructed to come in prior to day of surgery for EKG

## 2019-12-31 ENCOUNTER — Encounter (HOSPITAL_BASED_OUTPATIENT_CLINIC_OR_DEPARTMENT_OTHER)
Admission: RE | Admit: 2019-12-31 | Discharge: 2019-12-31 | Disposition: A | Discharge status: Home / Self Care | Payer: Medicaid Other | Source: Ambulatory Visit | Attending: Plastic Surgery | Admitting: Plastic Surgery

## 2019-12-31 DIAGNOSIS — Z01810 Encounter for preprocedural cardiovascular examination: Secondary | ICD-10-CM | POA: Diagnosis present

## 2020-01-02 ENCOUNTER — Other Ambulatory Visit (HOSPITAL_COMMUNITY)
Admission: RE | Admit: 2020-01-02 | Discharge: 2020-01-02 | Disposition: A | Discharge status: Home / Self Care | Payer: Medicaid Other | Source: Ambulatory Visit | Attending: Plastic Surgery | Admitting: Plastic Surgery

## 2020-01-02 DIAGNOSIS — Z20822 Contact with and (suspected) exposure to covid-19: Secondary | ICD-10-CM | POA: Diagnosis not present

## 2020-01-02 DIAGNOSIS — Z01812 Encounter for preprocedural laboratory examination: Secondary | ICD-10-CM | POA: Diagnosis present

## 2020-01-02 LAB — SARS CORONAVIRUS 2 (TAT 6-24 HRS): SARS Coronavirus 2: NEGATIVE

## 2020-01-05 ENCOUNTER — Ambulatory Visit (HOSPITAL_BASED_OUTPATIENT_CLINIC_OR_DEPARTMENT_OTHER)
Admission: RE | Admit: 2020-01-05 | Discharge: 2020-01-05 | Disposition: A | Discharge status: Home / Self Care | Payer: Medicaid Other | Attending: Plastic Surgery | Admitting: Plastic Surgery

## 2020-01-05 ENCOUNTER — Encounter (HOSPITAL_BASED_OUTPATIENT_CLINIC_OR_DEPARTMENT_OTHER): Payer: Self-pay | Admitting: Plastic Surgery

## 2020-01-05 ENCOUNTER — Ambulatory Visit (HOSPITAL_BASED_OUTPATIENT_CLINIC_OR_DEPARTMENT_OTHER): Payer: Medicaid Other | Admitting: Anesthesiology

## 2020-01-05 ENCOUNTER — Other Ambulatory Visit: Payer: Self-pay

## 2020-01-05 ENCOUNTER — Encounter (HOSPITAL_BASED_OUTPATIENT_CLINIC_OR_DEPARTMENT_OTHER)
Admission: RE | Disposition: A | Discharge status: Home / Self Care | Payer: Self-pay | Source: Home / Self Care | Attending: Plastic Surgery

## 2020-01-05 ENCOUNTER — Encounter: Payer: Medicaid Other | Admitting: Obstetrics and Gynecology

## 2020-01-05 DIAGNOSIS — M546 Pain in thoracic spine: Secondary | ICD-10-CM

## 2020-01-05 DIAGNOSIS — Z91040 Latex allergy status: Secondary | ICD-10-CM | POA: Insufficient documentation

## 2020-01-05 DIAGNOSIS — F419 Anxiety disorder, unspecified: Secondary | ICD-10-CM | POA: Insufficient documentation

## 2020-01-05 DIAGNOSIS — Z791 Long term (current) use of non-steroidal anti-inflammatories (NSAID): Secondary | ICD-10-CM | POA: Diagnosis not present

## 2020-01-05 DIAGNOSIS — Z96641 Presence of right artificial hip joint: Secondary | ICD-10-CM | POA: Diagnosis not present

## 2020-01-05 DIAGNOSIS — M545 Low back pain: Secondary | ICD-10-CM

## 2020-01-05 DIAGNOSIS — N62 Hypertrophy of breast: Secondary | ICD-10-CM

## 2020-01-05 DIAGNOSIS — F429 Obsessive-compulsive disorder, unspecified: Secondary | ICD-10-CM | POA: Diagnosis not present

## 2020-01-05 DIAGNOSIS — M4004 Postural kyphosis, thoracic region: Secondary | ICD-10-CM

## 2020-01-05 DIAGNOSIS — F329 Major depressive disorder, single episode, unspecified: Secondary | ICD-10-CM | POA: Insufficient documentation

## 2020-01-05 DIAGNOSIS — Z79899 Other long term (current) drug therapy: Secondary | ICD-10-CM | POA: Insufficient documentation

## 2020-01-05 DIAGNOSIS — Z7982 Long term (current) use of aspirin: Secondary | ICD-10-CM | POA: Diagnosis not present

## 2020-01-05 DIAGNOSIS — Z91013 Allergy to seafood: Secondary | ICD-10-CM | POA: Diagnosis not present

## 2020-01-05 DIAGNOSIS — I10 Essential (primary) hypertension: Secondary | ICD-10-CM | POA: Insufficient documentation

## 2020-01-05 HISTORY — PX: BREAST REDUCTION SURGERY: SHX8

## 2020-01-05 LAB — POCT PREGNANCY, URINE: Preg Test, Ur: NEGATIVE

## 2020-01-05 SURGERY — MAMMOPLASTY, REDUCTION
Anesthesia: General | Site: Breast | Laterality: Bilateral

## 2020-01-05 MED ORDER — CEFAZOLIN SODIUM-DEXTROSE 2-4 GM/100ML-% IV SOLN
INTRAVENOUS | Status: AC
  Filled 2020-01-05: qty 100

## 2020-01-05 MED ORDER — FENTANYL CITRATE (PF) 100 MCG/2ML IJ SOLN
INTRAMUSCULAR | Status: AC
  Filled 2020-01-05: qty 2

## 2020-01-05 MED ORDER — LACTATED RINGERS IV SOLN
INTRAVENOUS | Status: DC | PRN
  Administered 2020-01-05 (×2): 1000 mL

## 2020-01-05 MED ORDER — ONDANSETRON HCL 4 MG/2ML IJ SOLN
INTRAMUSCULAR | Status: AC
  Filled 2020-01-05: qty 2

## 2020-01-05 MED ORDER — FENTANYL CITRATE (PF) 100 MCG/2ML IJ SOLN
INTRAMUSCULAR | Status: DC | PRN
Start: 2020-01-05 — End: 2020-01-05
  Administered 2020-01-05 (×3): 50 ug via INTRAVENOUS
  Administered 2020-01-05: 100 ug via INTRAVENOUS
  Administered 2020-01-05: 50 ug via INTRAVENOUS

## 2020-01-05 MED ORDER — ACETAMINOPHEN 325 MG PO TABS
ORAL_TABLET | ORAL | Status: AC
  Filled 2020-01-05: qty 2

## 2020-01-05 MED ORDER — PROPOFOL 10 MG/ML IV BOLUS
INTRAVENOUS | Status: DC | PRN
  Administered 2020-01-05: 200 mg via INTRAVENOUS

## 2020-01-05 MED ORDER — OXYCODONE HCL 5 MG PO TABS: 5.0000 mg | ORAL_TABLET | Freq: Once | ORAL | Status: DC | PRN

## 2020-01-05 MED ORDER — ACETAMINOPHEN 160 MG/5ML PO SOLN: 325.0000 mg | ORAL | Status: DC | PRN

## 2020-01-05 MED ORDER — MIDAZOLAM HCL 2 MG/2ML IJ SOLN
INTRAMUSCULAR | Status: AC
  Filled 2020-01-05: qty 2

## 2020-01-05 MED ORDER — BUPIVACAINE HCL (PF) 0.25 % IJ SOLN
INTRAMUSCULAR | Status: AC
  Filled 2020-01-05: qty 30

## 2020-01-05 MED ORDER — SUGAMMADEX SODIUM 200 MG/2ML IV SOLN
INTRAVENOUS | Status: DC | PRN
  Administered 2020-01-05: 200 mg via INTRAVENOUS

## 2020-01-05 MED ORDER — FENTANYL CITRATE (PF) 100 MCG/2ML IJ SOLN
25.0000 ug | INTRAMUSCULAR | Status: DC | PRN
  Administered 2020-01-05 (×3): 50 ug via INTRAVENOUS

## 2020-01-05 MED ORDER — LACTATED RINGERS IV SOLN: INTRAVENOUS | Status: DC

## 2020-01-05 MED ORDER — MIDAZOLAM HCL 5 MG/5ML IJ SOLN
INTRAMUSCULAR | Status: DC | PRN
  Administered 2020-01-05: 2 mg via INTRAVENOUS

## 2020-01-05 MED ORDER — EPHEDRINE SULFATE 50 MG/ML IJ SOLN
INTRAMUSCULAR | Status: DC | PRN
  Administered 2020-01-05: 10 mg via INTRAVENOUS

## 2020-01-05 MED ORDER — LIDOCAINE 2% (20 MG/ML) 5 ML SYRINGE
INTRAMUSCULAR | Status: AC
  Filled 2020-01-05: qty 5

## 2020-01-05 MED ORDER — ONDANSETRON HCL 4 MG/2ML IJ SOLN: 4.0000 mg | Freq: Once | INTRAMUSCULAR | Status: DC | PRN

## 2020-01-05 MED ORDER — DEXTROSE 5 % IV SOLN
3.0000 g | INTRAVENOUS | Status: AC
  Administered 2020-01-05: 3 g via INTRAVENOUS
  Filled 2020-01-05: qty 3000

## 2020-01-05 MED ORDER — OXYCODONE HCL 5 MG/5ML PO SOLN: 5.0000 mg | Freq: Once | ORAL | Status: DC | PRN

## 2020-01-05 MED ORDER — CEFAZOLIN SODIUM-DEXTROSE 1-4 GM/50ML-% IV SOLN
INTRAVENOUS | Status: AC
  Filled 2020-01-05: qty 50

## 2020-01-05 MED ORDER — PROPOFOL 10 MG/ML IV BOLUS
INTRAVENOUS | Status: AC
  Filled 2020-01-05: qty 20

## 2020-01-05 MED ORDER — OXYCODONE HCL 5 MG PO TABS
ORAL_TABLET | ORAL | Status: AC
  Filled 2020-01-05: qty 1

## 2020-01-05 MED ORDER — ONDANSETRON HCL 4 MG/2ML IJ SOLN
INTRAMUSCULAR | Status: DC | PRN
  Administered 2020-01-05: 4 mg via INTRAVENOUS

## 2020-01-05 MED ORDER — DEXAMETHASONE SODIUM PHOSPHATE 10 MG/ML IJ SOLN
INTRAMUSCULAR | Status: AC
  Filled 2020-01-05: qty 1

## 2020-01-05 MED ORDER — DEXAMETHASONE SODIUM PHOSPHATE 4 MG/ML IJ SOLN
INTRAMUSCULAR | Status: DC | PRN
  Administered 2020-01-05: 5 mg via INTRAVENOUS

## 2020-01-05 MED ORDER — ACETAMINOPHEN 325 MG PO TABS: 325.0000 mg | ORAL_TABLET | ORAL | Status: DC | PRN

## 2020-01-05 MED ORDER — EPINEPHRINE PF 1 MG/ML IJ SOLN
INTRAMUSCULAR | Status: AC
  Filled 2020-01-05: qty 1

## 2020-01-05 MED ORDER — ROCURONIUM BROMIDE 100 MG/10ML IV SOLN
INTRAVENOUS | Status: DC | PRN
  Administered 2020-01-05: 60 mg via INTRAVENOUS

## 2020-01-05 MED ORDER — LIDOCAINE HCL (CARDIAC) PF 100 MG/5ML IV SOSY
PREFILLED_SYRINGE | INTRAVENOUS | Status: DC | PRN
  Administered 2020-01-05: 50 mg via INTRAVENOUS

## 2020-01-05 MED ORDER — MEPERIDINE HCL 25 MG/ML IJ SOLN: 6.2500 mg | INTRAMUSCULAR | Status: DC | PRN

## 2020-01-05 SURGICAL SUPPLY — 73 items
APL PRP STRL LF DISP 70% ISPRP (MISCELLANEOUS) ×2
APL SKNCLS STERI-STRIP NONHPOA (GAUZE/BANDAGES/DRESSINGS) ×2
BAG DECANTER FOR FLEXI CONT (MISCELLANEOUS) IMPLANT
BENZOIN TINCTURE PRP APPL 2/3 (GAUZE/BANDAGES/DRESSINGS) ×6 IMPLANT
BLADE SURG 10 STRL SS (BLADE) ×6 IMPLANT
BLADE SURG 15 STRL LF DISP TIS (BLADE) IMPLANT
BLADE SURG 15 STRL SS (BLADE)
BNDG ELASTIC 6X5.8 VLCR STR LF (GAUZE/BANDAGES/DRESSINGS) ×6 IMPLANT
CANISTER SUCT 1200ML W/VALVE (MISCELLANEOUS) ×3 IMPLANT
CHLORAPREP W/TINT 26 (MISCELLANEOUS) ×6 IMPLANT
CLIP VESOCCLUDE MED 6/CT (CLIP) IMPLANT
COVER BACK TABLE 60X90IN (DRAPES) ×3 IMPLANT
COVER MAYO STAND STRL (DRAPES) ×3 IMPLANT
COVER WAND RF STERILE (DRAPES) IMPLANT
DECANTER SPIKE VIAL GLASS SM (MISCELLANEOUS) IMPLANT
DRAIN CHANNEL 15F RND FF W/TCR (WOUND CARE) IMPLANT
DRAIN PENROSE 1/2X12 LTX STRL (WOUND CARE) ×3 IMPLANT
DRAPE LAPAROSCOPIC ABDOMINAL (DRAPES) ×3 IMPLANT
DRAPE UTILITY XL STRL (DRAPES) ×3 IMPLANT
DRSG PAD ABDOMINAL 8X10 ST (GAUZE/BANDAGES/DRESSINGS) ×12 IMPLANT
ELECT REM PT RETURN 9FT ADLT (ELECTROSURGICAL) ×3
ELECTRODE REM PT RTRN 9FT ADLT (ELECTROSURGICAL) ×1 IMPLANT
EVACUATOR SILICONE 100CC (DRAIN) IMPLANT
GAUZE SPONGE 4X4 12PLY STRL (GAUZE/BANDAGES/DRESSINGS) ×6 IMPLANT
GAUZE XEROFORM 5X9 LF (GAUZE/BANDAGES/DRESSINGS) IMPLANT
GLOVE BIO SURGEON STRL SZ 6.5 (GLOVE) IMPLANT
GLOVE BIO SURGEON STRL SZ7.5 (GLOVE) IMPLANT
GLOVE BIO SURGEONS STRL SZ 6.5 (GLOVE)
GLOVE BIOGEL M STRL SZ7.5 (GLOVE) ×3 IMPLANT
GLOVE BIOGEL PI IND STRL 8 (GLOVE) ×1 IMPLANT
GLOVE BIOGEL PI INDICATOR 8 (GLOVE) ×2
GLOVE ECLIPSE 6.5 STRL STRAW (GLOVE) IMPLANT
GLOVE SURG SYN 7.0 (GLOVE) ×3 IMPLANT
GLOVE SURG SYN 7.5  E (GLOVE) ×6
GLOVE SURG SYN 7.5 E (GLOVE) ×2 IMPLANT
GOWN STRL REUS W/ TWL LRG LVL3 (GOWN DISPOSABLE) ×3 IMPLANT
GOWN STRL REUS W/TWL LRG LVL3 (GOWN DISPOSABLE) ×9
MARKER SKIN DUAL TIP RULER LAB (MISCELLANEOUS) IMPLANT
NDL SAFETY ECLIPSE 18X1.5 (NEEDLE) ×1 IMPLANT
NEEDLE FILTER BLUNT 18X 1/2SAF (NEEDLE) ×2
NEEDLE FILTER BLUNT 18X1 1/2 (NEEDLE) ×1 IMPLANT
NEEDLE HYPO 18GX1.5 SHARP (NEEDLE) ×3
NEEDLE HYPO 25X1 1.5 SAFETY (NEEDLE) IMPLANT
NEEDLE SPNL 18GX3.5 QUINCKE PK (NEEDLE) ×3 IMPLANT
NS IRRIG 1000ML POUR BTL (IV SOLUTION) ×3 IMPLANT
PACK BASIN DAY SURGERY FS (CUSTOM PROCEDURE TRAY) ×3 IMPLANT
PENCIL SMOKE EVACUATOR (MISCELLANEOUS) ×3 IMPLANT
PIN SAFETY STERILE (MISCELLANEOUS) IMPLANT
SHEET MEDIUM DRAPE 40X70 STRL (DRAPES) IMPLANT
SLEEVE SCD COMPRESS KNEE MED (MISCELLANEOUS) ×3 IMPLANT
SPONGE LAP 18X18 RF (DISPOSABLE) ×9 IMPLANT
STAPLER INSORB 30 2030 C-SECTI (MISCELLANEOUS) ×3 IMPLANT
STAPLER VISISTAT 35W (STAPLE) ×6 IMPLANT
STRIP SUTURE WOUND CLOSURE 1/2 (MISCELLANEOUS) ×9 IMPLANT
SUT CHROMIC 4 0 PS 2 18 (SUTURE) IMPLANT
SUT ETHILON 2 0 FS 18 (SUTURE) IMPLANT
SUT ETHILON 3 0 PS 1 (SUTURE) IMPLANT
SUT MNCRL AB 4-0 PS2 18 (SUTURE) ×6 IMPLANT
SUT PDS 3-0 CT2 (SUTURE) ×9
SUT PDS II 3-0 CT2 27 ABS (SUTURE) ×3 IMPLANT
SUT VIC AB 3-0 PS1 18 (SUTURE)
SUT VIC AB 3-0 PS1 18XBRD (SUTURE) IMPLANT
SUT VLOC 90 P-14 23 (SUTURE) ×9 IMPLANT
SYR 50ML LL SCALE MARK (SYRINGE) ×3 IMPLANT
SYR BULB IRRIG 60ML STRL (SYRINGE) ×3 IMPLANT
SYR CONTROL 10ML LL (SYRINGE) IMPLANT
TAPE MEASURE VINYL STERILE (MISCELLANEOUS) IMPLANT
TOWEL GREEN STERILE FF (TOWEL DISPOSABLE) ×6 IMPLANT
TUBE CONNECTING 20'X1/4 (TUBING) ×1
TUBE CONNECTING 20X1/4 (TUBING) ×2 IMPLANT
TUBING INFILTRATION IT-10001 (TUBING) ×3 IMPLANT
UNDERPAD 30X36 HEAVY ABSORB (UNDERPADS AND DIAPERS) ×6 IMPLANT
YANKAUER SUCT BULB TIP NO VENT (SUCTIONS) ×3 IMPLANT

## 2020-01-05 NOTE — Op Note (Signed)
Operative Note   DATE OF OPERATION: 01/05/2020  LOCATION: [facility]   SURGICAL DEPARTMENT: Plastic Surgery  PREOPERATIVE DIAGNOSES: Bilateral symptomatic macromastia.  POSTOPERATIVE DIAGNOSES:  same  PROCEDURE: Bilateral breast reduction with superomedial pedicle.  SURGEON: Ancil Linsey, MD  ASSISTANT: Zadie Cleverly, PA The advanced practice practitioner (APP) assisted throughout the case.  The APP was essential in retraction and counter traction when needed to make the case progress smoothly.  This retraction and assistance made it possible to see the tissue plans for the procedure.  The assistance was needed for blood control, tissue re-approximation and assisted with closure of the incision site.  ANESTHESIA: General.  COMPLICATIONS: None.   INDICATIONS FOR PROCEDURE:  The patient, Megan [name] is a 49 y.o. female born on 17-Apr-1971, is here for treatment of bilateral symptomatic macromastia. MRN: [id number]  CONSENT:  Informed consent was obtained directly from the patient. Risks, benefits and alternatives were fully discussed. Specific risks including but not limited to bleeding, infection, hematoma, seroma, scarring, pain, infection, contracture, asymmetry, wound healing problems, and need for further surgery were all discussed. The patient did have an ample opportunity to have questions answered to satisfaction.   DESCRIPTION OF PROCEDURE:  The patient was marked preoperatively for a Wise pattern skin excision.  The patient was taken to the operating room. SCDs were placed and antibiotics were given. General anesthesia was administered.The patient's operative site was prepped and draped in a sterile fashion. A time out was performed and all information was confirmed to be correct.  Right Breast: The breast was infiltrated with tumescent solution to help with hemostasis.  The nipple was marked with a cookie cutter.  A superomedial pedicle was drawn out  with the base of at least 8 cm in size.  A breast tourniquet was then applied and the pedicle was de-epithelialized.  Breast tourniquet was then let down and all incisions were made with a 10 blade.  The pedicle was then isolated down to the chest wall with cautery and the excision was performed removing tissue primarily inferiorly and laterally.  Hemostasis was obtained and the wound was stapled closed.  Left breast:  The breast was infiltrated with tumescent solution to help with hemostasis.  The nipple was marked with a cookie cutter.  A superomedial pedicle was drawn out with the base of at least 8 cm in size.  A breast tourniquet was then applied and the pedicle was de-epithelialized.  Breast tourniquet was then let down and all incisions were made with a 10 blade.  The pedicle was then isolated down to the chest wall with cautery and the excision was performed removing tissue primarily inferiorly and laterally.  Hemostasis was obtained and the wound was stapled closed.  Patient was then set up to check for size and symmetry.  Minor modifications were made.  Penrose drains were left on both sides.  This resulted in a total of 1568 g removed from the right side and 1411 g removed from the left side.  The inframammary incision was closed with a combination of buried in-sorb staples and a running 3-0 Quill suture.  The vertical and periareolar limbs were closed with interrupted buried 4-0 Monocryl and a running 4-0 Quill suture.  Steri-Strips were then applied along with a soft dressing and Ace wrap.  The patient tolerated the procedure well.  There were no complications. The patient was allowed to wake from anesthesia, extubated and taken to the recovery room in satisfactory condition.  I  was present for the entire procedure.

## 2020-01-05 NOTE — Transfer of Care (Signed)
Immediate Anesthesia Transfer of Care Note  Patient: Megan [name]  Procedure(s) Performed: BILATERAL MAMMARY REDUCTION  (BREAST) (Bilateral Breast)  Patient Location: PACU  Anesthesia Type:General  Level of Consciousness: sedated  Airway & Oxygen Therapy: Patient Spontanous Breathing and Patient connected to face mask oxygen  Post-op Assessment: Report given to RN and Post -op Vital signs reviewed and stable  Post vital signs: Reviewed and stable  Last Vitals:  Vitals Value Taken Time  BP 141/87 01/05/20 1408  Temp 36.9 C 01/05/20 1408  Pulse 77 01/05/20 1413  Resp 18 01/05/20 1413  SpO2 100 % 01/05/20 1413  Vitals shown include unvalidated device data.  Last Pain:  Vitals:   01/05/20 1408  TempSrc:   PainSc: Asleep      Patients Stated Pain Goal: 4 (01/05/20 1043)  Complications: No complications documented.

## 2020-01-05 NOTE — Brief Op Note (Signed)
01/05/2020  1:53 PM  PATIENT:  Megan [name]  49 y.o. female  PRE-OPERATIVE DIAGNOSIS:  macromastia  POST-OPERATIVE DIAGNOSIS:  macromastia  PROCEDURE:  Procedure(s) with comments: BILATERAL MAMMARY REDUCTION  (BREAST) (Bilateral) - 2.5 hours  SURGEON:  Surgeon(s) and Role:    * Omer Puccinelli, Wendy Poet, MD - Primary  PHYSICIAN ASSISTANT: Materials engineer, PA  ASSISTANTS: none   ANESTHESIA:   general  EBL:  30 mL   BLOOD ADMINISTERED:none  DRAINS: Penrose drain in the bilateral breast   LOCAL MEDICATIONS USED:  MARCAINE     SPECIMEN:  Source of Specimen:  r and l breast  DISPOSITION OF SPECIMEN:  PATHOLOGY  COUNTS:  YES  TOURNIQUET:  * No tourniquets in log *  DICTATION: .Dragon Dictation  PLAN OF CARE: Discharge to home after PACU  PATIENT DISPOSITION:  PACU - hemodynamically stable.   Delay start of Pharmacological VTE agent (>24hrs) due to surgical blood loss or risk of bleeding: not applicable

## 2020-01-05 NOTE — Discharge Instructions (Addendum)
Activity As tolerated: NO showers for 3 days. Keep ACE wrap on breasts until then. After showering, put ACE wrap back on, this is important for compression. NO driving while in pain, taking pain medication or if you are unable to safely react to traffic. No heavy activities Take your pain medication (Norco) as needed for severe pain. Otherwise, you can use ibuprofen or tylenol as needed. Avoid more than 3,000 mg of tylenol in 24 hours. As a reminder, norco also has 325 mg of tylenol  Diet: Regular. Drink plenty of fluids and eat healthy, high protein, low carbs.  Wound Care: Keep dressing clean & dry. You may change bandages after showering if you continue to notice some drainage. You can reuse bandages if they are not dirty/soiled.  Special Instructions: Call Doctor if any unusual problems occur such as pain, excessive Bleeding, unrelieved Nausea/vomiting, Fever &/or chills  Follow-up appointment: Scheduled for next week.    Post Anesthesia Home Care Instructions  Activity: Get plenty of rest for the remainder of the day. A responsible individual must stay with you for 24 hours following the procedure.  For the next 24 hours, DO NOT: -Drive a car -Advertising copywriter -Drink alcoholic beverages -Take any medication unless instructed by your physician -Make any legal decisions or sign important papers.  Meals: Start with liquid foods such as gelatin or soup. Progress to regular foods as tolerated. Avoid greasy, spicy, heavy foods. If nausea and/or vomiting occur, drink only clear liquids until the nausea and/or vomiting subsides. Call your physician if vomiting continues.  Special Instructions/Symptoms: Your throat may feel dry or sore from the anesthesia or the breathing tube placed in your throat during surgery. If this causes discomfort, gargle with warm salt water. The discomfort should disappear within 24 hours.

## 2020-01-05 NOTE — Anesthesia Postprocedure Evaluation (Signed)
Anesthesia Post Note  Patient: Mekayla Soman  Procedure(s) Performed: BILATERAL MAMMARY REDUCTION  (BREAST) (Bilateral Breast)     Patient location during evaluation: Phase II Anesthesia Type: General Level of consciousness: awake and alert, patient cooperative and oriented Pain management: pain level controlled Vital Signs Assessment: post-procedure vital signs reviewed and stable Respiratory status: spontaneous breathing, nonlabored ventilation and respiratory function stable Cardiovascular status: blood pressure returned to baseline and stable Postop Assessment: no apparent nausea or vomiting and adequate PO intake Anesthetic complications: no   No complications documented.  Last Vitals:  Vitals:   01/05/20 1500 01/05/20 1515  BP: (!) 157/93 (!) 148/56  Pulse: 70 63  Resp: 12 16  Temp:  36.7 C  SpO2: 93% 96%    Last Pain:  Vitals:   01/05/20 1515  TempSrc:   PainSc: Asleep                 Tsuyako Jolley,E. Gargi Berch

## 2020-01-05 NOTE — Interval H&P Note (Signed)
History and Physical Interval Note:  01/05/2020 11:15 AM  Megan [name]  has presented today for surgery, with the diagnosis of macromastia.  The various methods of treatment have been discussed with the patient and family. After consideration of risks, benefits and other options for treatment, the patient has consented to  Procedure(s) with comments: MAMMARY REDUCTION  (BREAST) (Bilateral) - 2.5 hours as a surgical intervention.  The patient's history has been reviewed, patient examined, no change in status, stable for surgery.  I have reviewed the patient's chart and labs.  Questions were answered to the patient's satisfaction.     [name]

## 2020-01-05 NOTE — Anesthesia Procedure Notes (Signed)
Procedure Name: Intubation Date/Time: 01/05/2020 11:58 AM Performed by: Maryella Shivers, CRNA Pre-anesthesia Checklist: Patient identified, Emergency Drugs available, Suction available and Patient being monitored Patient Re-evaluated:Patient Re-evaluated prior to induction Oxygen Delivery Method: Circle system utilized Preoxygenation: Pre-oxygenation with 100% oxygen Induction Type: IV induction Ventilation: Mask ventilation without difficulty Laryngoscope Size: Mac and 4 Grade View: Grade I Tube type: Oral Tube size: 7.0 mm Number of attempts: 1 Airway Equipment and Method: Stylet and Oral airway Placement Confirmation: ETT inserted through vocal cords under direct vision,  positive ETCO2 and breath sounds checked- equal and bilateral Secured at: 22 cm Tube secured with: Tape Dental Injury: Teeth and Oropharynx as per pre-operative assessment

## 2020-01-05 NOTE — Anesthesia Preprocedure Evaluation (Addendum)
Anesthesia Evaluation  Patient identified by MRN, date of birth, ID band Patient awake    Reviewed: Allergy & Precautions, NPO status , Patient's Chart, lab work & pertinent test results  History of Anesthesia Complications (+) PONV and history of anesthetic complications  Airway Mallampati: I  TM Distance: >3 FB Neck ROM: Full    Dental  (+) Dental Advisory Given, Teeth Intact, Chipped,    Pulmonary neg pulmonary ROS,    breath sounds clear to auscultation       Cardiovascular hypertension, Pt. on medications  Rhythm:Regular Rate:Normal     Neuro/Psych Anxiety Depression  Neuromuscular disease    GI/Hepatic negative GI ROS, Neg liver ROS,   Endo/Other  negative endocrine ROS  Renal/GU negative Renal ROS     Musculoskeletal  (+) Arthritis , Osteoarthritis,    Abdominal (+) + obese,   Peds  Hematology negative hematology ROS (+)   Anesthesia Other Findings   Reproductive/Obstetrics                            Lab Results  Component Value Date   WBC 6.3 07/21/2017   HGB 10.3 (L) 07/21/2017   HCT 32.3 (L) 07/21/2017   MCV 85.2 07/21/2017   PLT 198 07/21/2017   Lab Results  Component Value Date   CREATININE 0.82 07/20/2017   BUN 8 07/20/2017   NA 138 07/20/2017   K 4.1 07/20/2017   CL 107 07/20/2017   CO2 25 07/20/2017    Anesthesia Physical  Anesthesia Plan  ASA: II  Anesthesia Plan: General   Post-op Pain Management:    Induction: Intravenous  PONV Risk Score and Plan: 3 and Midazolam, Dexamethasone, Ondansetron and Treatment may vary due to age or medical condition  Airway Management Planned: Oral ETT and LMA  Additional Equipment:   Intra-op Plan:   Post-operative Plan: Extubation in OR  Informed Consent: I have reviewed the patients History and Physical, chart, labs and discussed the procedure including the risks, benefits and alternatives for the proposed  anesthesia with the patient or authorized representative who has indicated his/her understanding and acceptance.     Dental advisory given  Plan Discussed with: CRNA and Anesthesiologist  Anesthesia Plan Comments:         Anesthesia Quick Evaluation

## 2020-01-06 ENCOUNTER — Encounter (HOSPITAL_BASED_OUTPATIENT_CLINIC_OR_DEPARTMENT_OTHER): Payer: Self-pay | Admitting: Plastic Surgery

## 2020-01-06 LAB — SURGICAL PATHOLOGY

## 2020-01-13 ENCOUNTER — Encounter: Payer: Self-pay | Admitting: Plastic Surgery

## 2020-01-13 ENCOUNTER — Ambulatory Visit (INDEPENDENT_AMBULATORY_CARE_PROVIDER_SITE_OTHER): Payer: Medicaid Other | Admitting: Plastic Surgery

## 2020-01-13 ENCOUNTER — Other Ambulatory Visit: Payer: Self-pay

## 2020-01-13 VITALS — BP 140/87 | HR 60 | Temp 98.5°F

## 2020-01-13 DIAGNOSIS — N62 Hypertrophy of breast: Secondary | ICD-10-CM

## 2020-01-13 NOTE — Progress Notes (Signed)
Patient presents 1 week postop from bilateral breast reduction.  Overall she is very happy and things seem to be going well.  She is had minimal pain.  On examination her incisions are intact and all the tissue looks viable.  She has Penrose drains in the bilateral lateral aspects of the inframammary incisions and I removed the today as they have not been draining very much.  We will have her continue to wear compressive garments and avoid strenuous activity and I will plan to see her again in another week or 2.

## 2020-01-27 NOTE — Progress Notes (Signed)
Patient is a 49 year old female here for follow-up after bilateral breast reduction with Dr. Arita Miss on 01/05/2020.  She is 3 weeks postop.  She reports she is overall doing well, she has noticed some drainage from her right breast drain site wound, reports she has not removed the dressing since her last appointment and has noticed a smell.  She is not having any fevers, chills, nausea, vomiting.  She feels as if her breasts are slightly swollen.  She has been wearing a sports bra.  Chaperone present on exam On exam bilateral NAC are viable, bilateral breast incisions are intact with the exception of a small wound noted at the junction of the vertical limb and inframammary fold on the right breast and a drain site wound on the right lateral breast.  No erythema or cellulitic changes noted over either breast.  Bilateral breasts are soft, subsubcutaneous swelling noted, bilateral breasts are symmetric.  Slight fluid wave noted with palpation.  We aspirated left breast using a sterile technique, patient tolerated this fine, no fluid was aspirated. Discussed with patient I recommend continue to wear compressive garments, may need to purchase a more compressive garment to assist with decreasing swelling. Recommend Vaseline gauze to right breast wounds daily. No sign of infection.  Would like to see patient back in 2 to 3 weeks for reevaluation.

## 2020-01-28 ENCOUNTER — Encounter: Payer: Self-pay | Admitting: Surgical

## 2020-01-28 ENCOUNTER — Ambulatory Visit (INDEPENDENT_AMBULATORY_CARE_PROVIDER_SITE_OTHER): Payer: Medicaid Other | Admitting: Surgical

## 2020-01-28 ENCOUNTER — Other Ambulatory Visit: Payer: Self-pay

## 2020-01-28 VITALS — BP 130/77 | HR 64 | Temp 97.7°F

## 2020-01-28 DIAGNOSIS — N62 Hypertrophy of breast: Secondary | ICD-10-CM

## 2020-01-28 DIAGNOSIS — Z9889 Other specified postprocedural states: Secondary | ICD-10-CM

## 2020-02-08 NOTE — Progress Notes (Deleted)
Patient is a 49-yr-old female here for follow-up after bilateral breast reduction on 01/05/20 with Dr. Arita Miss. At last visit on 01/28/20, patient had a small wound on the junction of the vertical limb and IMF of the right breast and at right drain site. Aspiration was attempted, but not fluid was obtained.  ~ 5 weeks PO

## 2020-02-11 ENCOUNTER — Ambulatory Visit: Payer: Medicaid Other | Admitting: Plastic Surgery

## 2020-03-16 ENCOUNTER — Ambulatory Visit: Payer: Medicaid Other | Attending: Physician Assistant

## 2020-03-16 ENCOUNTER — Other Ambulatory Visit: Payer: Self-pay

## 2020-03-16 DIAGNOSIS — M25662 Stiffness of left knee, not elsewhere classified: Secondary | ICD-10-CM | POA: Insufficient documentation

## 2020-03-16 DIAGNOSIS — M25661 Stiffness of right knee, not elsewhere classified: Secondary | ICD-10-CM

## 2020-03-16 DIAGNOSIS — M25552 Pain in left hip: Secondary | ICD-10-CM | POA: Insufficient documentation

## 2020-03-16 DIAGNOSIS — M2550 Pain in unspecified joint: Secondary | ICD-10-CM | POA: Insufficient documentation

## 2020-03-16 DIAGNOSIS — M6281 Muscle weakness (generalized): Secondary | ICD-10-CM | POA: Diagnosis present

## 2020-03-16 DIAGNOSIS — M7062 Trochanteric bursitis, left hip: Secondary | ICD-10-CM | POA: Insufficient documentation

## 2020-03-17 NOTE — Therapy (Addendum)
[place], [place], [address] Phone: [phone]   Fax:  [phone]  Physical Therapy Evaluation/Discharge  Patient Details  Name: Megan [name] MRN: [id number] Date of Birth: 03-01-1971 Referring Provider (PT): Pete Pelt, [place]   Encounter Date: 03/16/2020   PT End of Session - 03/17/20 2306    Visit Number 1    Number of Visits 13    Date for PT Re-Evaluation 05/14/20    Authorization Type [place] MEDICAID [employer] [place]    PT Start Time 6599    PT Stop Time 1453    PT Time Calculation (min) 49 min    Activity Tolerance Patient tolerated treatment well    Behavior During Therapy [facility] for tasks assessed/performed           Past Medical History:  Diagnosis Date  . Arthritis   . Complication of anesthesia    the last surgeries she has had patient states she woke up during surgery  . Depression Dx 2010  . Hypertension Dx 2015   last time meds taken - 2016   . OCD (obsessive compulsive disorder)   . PONV (postoperative nausea and vomiting)   . Tuberculosis    latent TB- 2000 - received treatment     Past Surgical History:  Procedure Laterality Date  . BREAST REDUCTION SURGERY Bilateral 01/05/2020   Procedure: BILATERAL MAMMARY REDUCTION  (BREAST);  Surgeon: Cindra Presume, MD;  Location: [facility];  Service: Plastics;  Laterality: Bilateral;  2.5 hours  . COLONOSCOPY    . TOOTH EXTRACTION    . TOTAL HIP ARTHROPLASTY Right 07/19/2017   Procedure: RIGHT TOTAL HIP ARTHROPLASTY ANTERIOR APPROACH;  Surgeon: Mcarthur Rossetti, MD;  Location: [facility];  Service: Orthopedics;  Laterality: Right;    There were no vitals filed for this visit.    Subjective Assessment - 03/17/20 2345    Subjective Pt reports she is not having L hip pain today, but she does intermittently. Pt states the R hip has been bothering her. Pt underwent a R THA March 2019. Pt states she has  multiple areas pain to the degree she needs assistance with dressing. After which, she feels like she has run a marathon.    Limitations Standing;Sitting;Lifting;House hold activities    How long can you sit comfortably? 10 mins    How long can you stand comfortably? 10 mins    How long can you walk comfortably? 5 mins    Diagnostic tests 10/12/19 Xray-AP pelvis lateral view left hip: Status post right total hip arthroplasty with well-seated components.  Bilateral hips are well located.  Left hip with slight periarticular spurs but otherwise the hip joint is well-maintained.  No acute fractures no evidence of AVN.    Patient Stated Goals and have less pain    Currently in Pain? Yes   Currently not having L hip pain   Pain Score 8     Pain Location Hip    Pain Orientation Left;Lateral    Pain Descriptors / Indicators Aching;Throbbing;Sharp    Pain Type Chronic pain    Pain Frequency Constant    Aggravating Factors  light activity    Pain Relieving Factors rest    Effect of Pain on Daily Activities significant impact              [facility] PT Assessment - 03/17/20 0001      Assessment   Medical Diagnosis Trochanteric bursitis of L  hip; Pain in L hip; Arthralgia of multiple sites    Referring Provider (PT) Pete Pelt, PA-C    Onset Date/Surgical Date --   prolonged time fram   Hand Dominance Right    Prior Therapy yes      Precautions   Precautions None      Restrictions   Weight Bearing Restrictions No      Balance Screen   Has the patient fallen in the past 6 months No   Pt reports near falls     [place] residence    Living Arrangements Spouse/significant other;Children    [place] to enter    Entrance Stairs-Number of Steps 2    Entrance Stairs-Rails None    Home Layout Two level    Alternate Level Stairs-Number of Steps 14    Alternate Level Stairs-Rails Left      Prior Function   Vocation Unemployed      Cognition    Overall Cognitive Status Within Functional Limits for tasks assessed      Observation/Other Assessments   Focus on Therapeutic Outcomes (FOTO)  NA      Sensation   Light Touch Appears Intact      Posture/Postural Control   Posture/Postural Control Postural limitations    Postural Limitations Rounded Shoulders;Forward head;Increased lumbar lordosis      ROM / Strength   AROM / PROM / Strength AROM;Strength      AROM   Overall AROM Comments In sitting, pt can only flex each hip enough to lift each foot 4" off the floor.Marland Kitchen PROM for hip flexion is limited to 90d bilat.      Strength   Strength Assessment Site Hip    Right/Left Hip Right;Left    Right Hip Flexion 2+/5    Right Hip Extension 2+/5    Right Hip External Rotation  2+/5    Right Hip Internal Rotation 2+/5    Right Hip ABduction 2+/5    Right Hip ADduction 2+/5    Left Hip Flexion 3-/5    Left Hip Extension 3-/5    Left Hip External Rotation 3-/5    Left Hip Internal Rotation 3-/5    Left Hip ABduction 3-/5    Left Hip ADduction 3-/5      Transfers   Transfers Sit to Stand;Stand to Sit    Sit to Stand 6: Modified independent (Device/Increase time)   labored     Ambulation/Gait   Gait Pattern Within Functional Limits;Step-through pattern   appropriate pace                     Objective measurements completed on examination: See above findings.               PT Education - 03/17/20 2306    Education Details Eval finding, POC, HEP for hip strengthening    Person(s) Educated Patient    Methods Explanation;Demonstration;Tactile cues;Verbal cues;Handout    Comprehension Verbalized understanding;Returned demonstration;Verbal cues required;Tactile cues required;Need further instruction            PT Short Term Goals - 03/17/20 2334      PT SHORT TERM GOAL #1   Title pt will be Ind in an initial HEP    Status New    Target Date 04/14/20      PT SHORT TERM GOAL #2   Title Pt will  voice understanding of measures to assited with the decrease and management  of pain.    Status New    Target Date 04/14/20             PT Long Term Goals - 03/17/20 2338      PT LONG TERM GOAL #1   Title Pt will reports a decrease in pain level with daily activities to 5/10 or less    Baseline 8/10    Status New    Target Date 05/14/20      PT LONG TERM GOAL #2   Title Pt will demonstrate 3+ to 4/5 strength of B hips for improved fuctional mobility    Status New    Target Date 05/14/20      PT LONG TERM GOAL #3   Title Pt will be Ind in a final HEP to maintain or progress achieved LOF    Status New    Target Date 05/14/20                  Plan - 03/17/20 2318    Clinical Impression Statement Pt presents with a Hx of multiple areas of chronic pain, limited hip ROM and decreased strength of B LEs. Per pt's subjective report, pt's activity level is limited by pain and fatigue. Ptwill benefit from PT 2w6 to address strength and ROM to improve pts functional mobility.    Personal Factors and Comorbidities Time since onset of injury/illness/exacerbation    Examination-Activity Limitations Bathing;Locomotion Level;Reach Overhead;Sit;Sleep;Dressing;Squat;Stairs;Stand;Lift;Caring for Others;Carry;Bed Mobility    Stability/Clinical Decision Making Evolving/Moderate complexity    Rehab Potential Fair    PT Frequency 2x / week    PT Duration 6 weeks    PT Treatment/Interventions ADLs/Self Care Home Management;Cryotherapy;Electrical Stimulation;Contrast Bath;Ultrasound;Moist Heat;Iontophoresis 73m/ml Dexamethasone;Functional mobility training;Therapeutic activities;Therapeutic exercise;Balance training;Manual techniques;Patient/family education;Passive range of motion;Energy conservation;Dry needling;Taping;Vasopneumatic Device    PT Next Visit Plan Assess response to HEP. Progress HEP and activity level as indicated. Assess 5xSTS and 2 MWT and set goals.    PT Home Exercise Plan  [email or web address]    Consulted and Agree with Plan of Care Patient           Patient will benefit from skilled therapeutic intervention in order to improve the following deficits and impairments:  Difficulty walking, Decreased endurance, Obesity, Decreased activity tolerance, Pain, Decreased balance, Decreased mobility, Decreased strength, Postural dysfunction  Visit Diagnosis: Arthralgia of multiple sites  Pain in left hip  Trochanteric bursitis of left hip  Muscle weakness (generalized)  Decreased range of motion of both lower extremities     Problem List Patient Active Problem List   Diagnosis Date Noted  . Status post total replacement of right hip 07/19/2017  . Unilateral primary osteoarthritis, right hip 04/18/2017  . Pain of right hip joint 04/18/2017  . Chronic pain of right hip 07/20/2014  . Memory loss 07/20/2014  . Sciatica 01/25/2014  . Essential hypertension 01/25/2014  . Hair thinning 10/16/2013  . Depression 10/13/2013  . Anxiety state, unspecified 10/13/2013  . Leg cramps 10/13/2013   AGar PontoMS, PT 03/17/20 11:47 PM  C[facility]CE[facility] Center[address]GPotala [place] [address]Phone: [phone]  Fax:  [phone] Name: DDeklynn CharletMRN: [id number] of Birth: 1September 11, 1972  PHYSICAL THERAPY DISCHARGE SUMMARY  Visits from Start of Care: 1, eval only  Current functional level related to goals / functional outcomes: See above   Remaining deficits: Unknown, pt did not return after eval   Education / Equipment: HEP Plan: Patient agrees to discharge.  Patient goals were not met. Patient is being discharged due to not returning since the last visit.  ?????   Bricyn Labrada MS, PT 07/14/20 12:33 PM

## 2020-04-08 ENCOUNTER — Ambulatory Visit: Payer: Medicaid Other | Attending: Neurosurgery

## 2020-04-12 ENCOUNTER — Ambulatory Visit: Payer: Medicaid Other

## 2020-04-14 ENCOUNTER — Ambulatory Visit: Payer: Medicaid Other

## 2020-04-14 ENCOUNTER — Telehealth: Payer: Self-pay | Admitting: Physical Therapy

## 2020-04-14 NOTE — Telephone Encounter (Signed)
Message left for Ms Megan [name]. She has missed/no showed  last 2 appointments. She was informed that next appointment was 12.13 at 9:15  And she should call if she was not ale to make this appointment . Also she was informed if she no shoed her next appointment all appointments would be canceled and she would have to call to schedule her next appointment

## 2020-04-18 ENCOUNTER — Ambulatory Visit: Payer: Medicaid Other

## 2020-04-19 ENCOUNTER — Encounter: Payer: Medicaid Other | Admitting: Obstetrics and Gynecology

## 2020-04-21 ENCOUNTER — Ambulatory Visit: Payer: Medicaid Other

## 2020-07-04 ENCOUNTER — Ambulatory Visit (INDEPENDENT_AMBULATORY_CARE_PROVIDER_SITE_OTHER): Payer: Medicaid Other | Admitting: Obstetrics

## 2020-07-04 ENCOUNTER — Other Ambulatory Visit (HOSPITAL_COMMUNITY)
Admission: RE | Admit: 2020-07-04 | Discharge: 2020-07-04 | Disposition: A | Discharge status: Home / Self Care | Payer: Medicaid Other | Source: Ambulatory Visit | Attending: Obstetrics | Admitting: Obstetrics

## 2020-07-04 ENCOUNTER — Encounter: Payer: Self-pay | Admitting: Obstetrics

## 2020-07-04 ENCOUNTER — Other Ambulatory Visit: Payer: Self-pay

## 2020-07-04 VITALS — BP 145/91 | HR 81 | Ht 69.0 in | Wt 285.0 lb

## 2020-07-04 DIAGNOSIS — N393 Stress incontinence (female) (male): Secondary | ICD-10-CM | POA: Diagnosis not present

## 2020-07-04 DIAGNOSIS — Z01419 Encounter for gynecological examination (general) (routine) without abnormal findings: Secondary | ICD-10-CM | POA: Diagnosis not present

## 2020-07-04 DIAGNOSIS — N898 Other specified noninflammatory disorders of vagina: Secondary | ICD-10-CM

## 2020-07-04 DIAGNOSIS — Z6841 Body Mass Index (BMI) 40.0 and over, adult: Secondary | ICD-10-CM

## 2020-07-04 DIAGNOSIS — Z113 Encounter for screening for infections with a predominantly sexual mode of transmission: Secondary | ICD-10-CM

## 2020-07-04 LAB — POCT URINALYSIS DIPSTICK

## 2020-07-04 NOTE — Addendum Note (Signed)
Addended by: Leola Brazil on: 07/04/2020 04:18 PM   Modules accepted: Orders

## 2020-07-04 NOTE — Progress Notes (Signed)
Subjective:        Megan [name] is a 50 y.o. female here for a routine exam.  Current complaints: Lower abdominal pain and vaginal discharge.  Also leaks urine with cough, sneeze, laughter, etc.  Personal health questionnaire:  Is patient Ashkenazi Jewish, have a family history of breast and/or ovarian cancer: no Is there a family history of uterine cancer diagnosed at age < 53, gastrointestinal cancer, urinary tract cancer, family member who is a Personnel officer syndrome-associated carrier: yes Is the patient overweight and hypertensive, family history of diabetes, personal history of gestational diabetes, preeclampsia or PCOS: yes Is patient over 14, have PCOS,  family history of premature CHD under age 59, diabetes, smoke, have hypertension or peripheral artery disease:  no At any time, has a partner hit, kicked or otherwise hurt or frightened you?: no Over the past 2 weeks, have you felt down, depressed or hopeless?: no Over the past 2 weeks, have you felt little interest or pleasure in doing things?:no   Gynecologic History No LMP recorded. Contraception: none Last Pap: 2021. Results not available, but was told that she needed colposcopy done Last mammogram: 12-21-2019. Results were: normal  Obstetric History OB History  Gravida Para Term Preterm AB Living  9 3     6 3   SAB IAB Ectopic Multiple Live Births    6          # Outcome Date GA Lbr Len/2nd Weight Sex Delivery Anes PTL Lv  9 IAB           8 IAB           7 IAB           6 IAB           5 IAB           4 IAB           3 Para           2 Para           1 Para             Past Medical History:  Diagnosis Date  . Arthritis   . Complication of anesthesia    the last surgeries she has had patient states she woke up during surgery  . Depression Dx 2010  . Hypertension Dx 2015   last time meds taken - 2016   . OCD (obsessive compulsive disorder)   . PONV (postoperative nausea and vomiting)   . Tuberculosis     latent TB- 2000 - received treatment     Past Surgical History:  Procedure Laterality Date  . BREAST REDUCTION SURGERY Bilateral 01/05/2020   Procedure: BILATERAL MAMMARY REDUCTION  (BREAST);  Surgeon: 01/07/2020, MD;  Location: [facility];  Service: Plastics;  Laterality: Bilateral;  2.5 hours  . COLONOSCOPY    . TOOTH EXTRACTION    . TOTAL HIP ARTHROPLASTY Right 07/19/2017   Procedure: RIGHT TOTAL HIP ARTHROPLASTY ANTERIOR APPROACH;  Surgeon: 07/21/2017, MD;  Location: [facility];  Service: Orthopedics;  Laterality: Right;     Current Outpatient Medications:  .  acetaminophen (TYLENOL) 500 MG tablet, Take 1 tablet (500 mg total) by mouth every 6 (six) hours as needed. For use AFTER surgery, Disp: 30 tablet, Rfl: 0 .  amLODipine (NORVASC) 10 MG tablet, Take 1 tablet (10 mg total) by mouth daily., Disp: 30 tablet, Rfl: 3 .  FLUoxetine (PROZAC) 40 MG capsule, Take  40 mg by mouth daily., Disp: , Rfl:  .  gabapentin (NEURONTIN) 300 MG capsule, Take 300 mg by mouth 3 (three) times daily., Disp: , Rfl:  .  HYDROcodone-acetaminophen (NORCO) 10-325 MG tablet, Take 1 tablet by mouth 2 (two) times daily as needed., Disp: , Rfl:  .  meloxicam (MOBIC) 15 MG tablet, Take 15 mg by mouth daily., Disp: , Rfl:  .  ondansetron (ZOFRAN) 4 MG tablet, Take 1 tablet (4 mg total) by mouth every 8 (eight) hours as needed for nausea or vomiting., Disp: 20 tablet, Rfl: 0 .  Oxcarbazepine (TRILEPTAL) 300 MG tablet, Take 300 mg by mouth 2 (two) times daily., Disp: , Rfl:  .  Vitamin D, Ergocalciferol, (DRISDOL) 50000 units CAPS capsule, Take 50,000 Units by mouth every Thursday. IN THE MORNING., Disp: , Rfl:  Allergies  Allergen Reactions  . Shellfish Allergy Anaphylaxis  . Latex Rash    Social History   Tobacco Use  . Smoking status: Never Smoker  . Smokeless tobacco: Never Used  Substance Use Topics  . Alcohol use: No    Family History  Problem Relation Age of Onset  . Cancer  Mother   . Cancer Father       Review of Systems  Constitutional: negative for fatigue and weight loss Respiratory: negative for cough and wheezing Cardiovascular: negative for chest pain, fatigue and palpitations Gastrointestinal: negative for abdominal pain and change in bowel habits Musculoskeletal:negative for myalgias Neurological: negative for gait problems and tremors Behavioral/Psych: negative for abusive relationship, depression Endocrine: negative for temperature intolerance    Genitourinary:negative for abnormal menstrual periods, genital lesions, hot flashes, sexual problems.  Positive for vaginal discharge Integument/breast: negative for breast lump, breast tenderness, nipple discharge and skin lesion(s)    Objective:       BP (!) 145/91   Pulse 81   Ht 5\' 9"  (1.753 m)   Wt 285 lb (129.3 kg)   BMI 42.09 kg/m  General:   alert and no distress  Skin:   no rash or abnormalities  Lungs:   clear to auscultation bilaterally  Heart:   regular rate and rhythm, S1, S2 normal, no murmur, click, rub or gallop  Breasts:   normal without suspicious masses, skin or nipple changes or axillary nodes  Abdomen:  normal findings: no organomegaly, soft, non-tender and no hernia  Pelvis:  External genitalia: normal general appearance Urinary system: urethral meatus normal and bladder without fullness, nontender Vaginal: normal without tenderness, induration or masses Cervix: normal appearance Adnexa: normal bimanual exam Uterus: anteverted and non-tender, normal size   Lab Review Urine pregnancy test Labs reviewed yes Radiologic studies reviewed no  50% of 20 min visit spent on counseling and coordination of care.   Assessment:     1. Encounter for gynecological examination with Papanicolaou smear of cervix Rx: - Cytology - PAP  2. Vaginal discharge Rx: - Cervicovaginal ancillary only  3. Screening for STD (sexually transmitted disease) Rx: - HIV Antibody (routine  testing w rflx) - Hepatitis B surface antigen - RPR - Hepatitis C antibody  4. SUI (stress urinary incontinence, female) - Kegel's recommended - urine culture   5. Class 3 severe obesity due to excess calories without serious comorbidity with body mass index (BMI) of 40.0 to 44.9 in adult [facility]) - program of caloric reduction, exercise and behavioral modification recommended   Plan:    Education reviewed: calcium supplements, depression evaluation, low fat, low cholesterol diet, safe sex/STD prevention, self breast exams and  weight bearing exercise. Follow up in: 2 weeks.   No orders of the defined types were placed in this encounter.  Orders Placed This Encounter  Procedures  . Urine Culture  . HIV Antibody (routine testing w rflx)  . Hepatitis B surface antigen  . RPR  . Hepatitis C antibody    Brock Bad, MD 07/04/2020 3:37 PM

## 2020-07-05 LAB — CERVICOVAGINAL ANCILLARY ONLY
Bacterial Vaginitis (gardnerella): NEGATIVE
Candida Glabrata: NEGATIVE
Candida Vaginitis: NEGATIVE
Chlamydia: NEGATIVE
Comment: NEGATIVE
Comment: NEGATIVE
Comment: NEGATIVE
Comment: NEGATIVE
Comment: NEGATIVE
Comment: NORMAL
Neisseria Gonorrhea: NEGATIVE
Trichomonas: NEGATIVE

## 2020-07-05 LAB — HEPATITIS C ANTIBODY: Hep C Virus Ab: <0.1 s/co ratio (ref 0.0–0.9)

## 2020-07-05 LAB — RPR: RPR Ser Ql: NONREACTIVE

## 2020-07-05 LAB — HEPATITIS B SURFACE ANTIGEN: Hepatitis B Surface Ag: NEGATIVE

## 2020-07-05 LAB — HIV ANTIBODY (ROUTINE TESTING W REFLEX): HIV Screen 4th Generation wRfx: NONREACTIVE

## 2020-07-06 LAB — CYTOLOGY - PAP
Comment: NEGATIVE
Diagnosis: NEGATIVE
High risk HPV: NEGATIVE

## 2020-07-06 LAB — URINE CULTURE: Organism ID, Bacteria: NO GROWTH

## 2020-07-18 ENCOUNTER — Telehealth: Payer: Medicaid Other | Admitting: Obstetrics and Gynecology

## 2020-07-18 NOTE — Progress Notes (Signed)
Not able to reach patient for follow up virtual appointment. Voicemail message left

## 2022-04-28 IMAGING — MG DIGITAL SCREENING BILAT W/ CAD
7 series · 7 of 7 positions shown · non-contrast
Comparison: Previous exam(s).

CLINICAL DATA: Screening.

EXAM:
DIGITAL SCREENING BILATERAL MAMMOGRAM WITH CAD

[R CC]
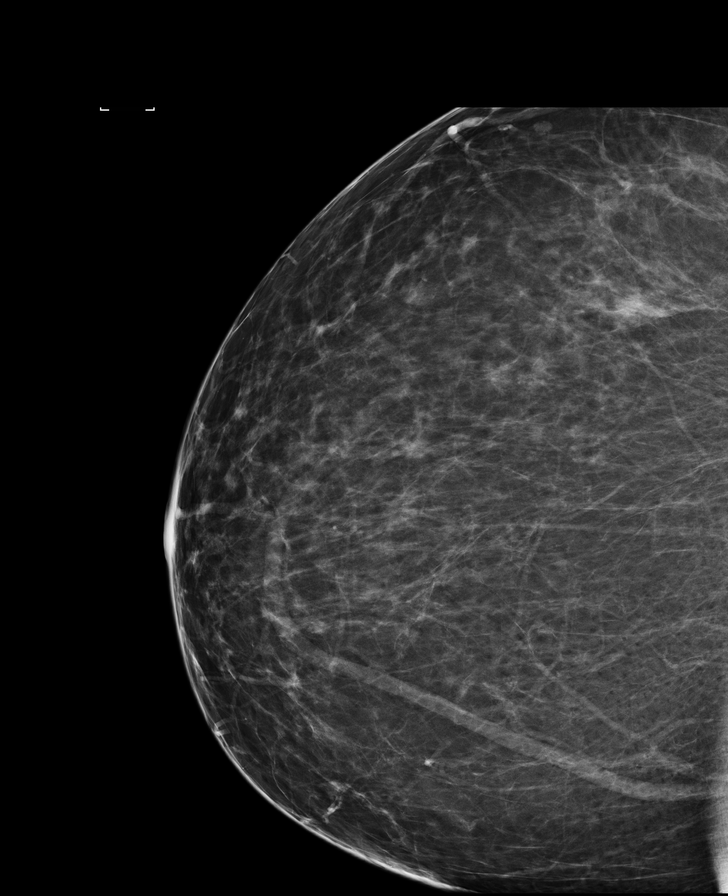

[L MLO (1 of 2)]
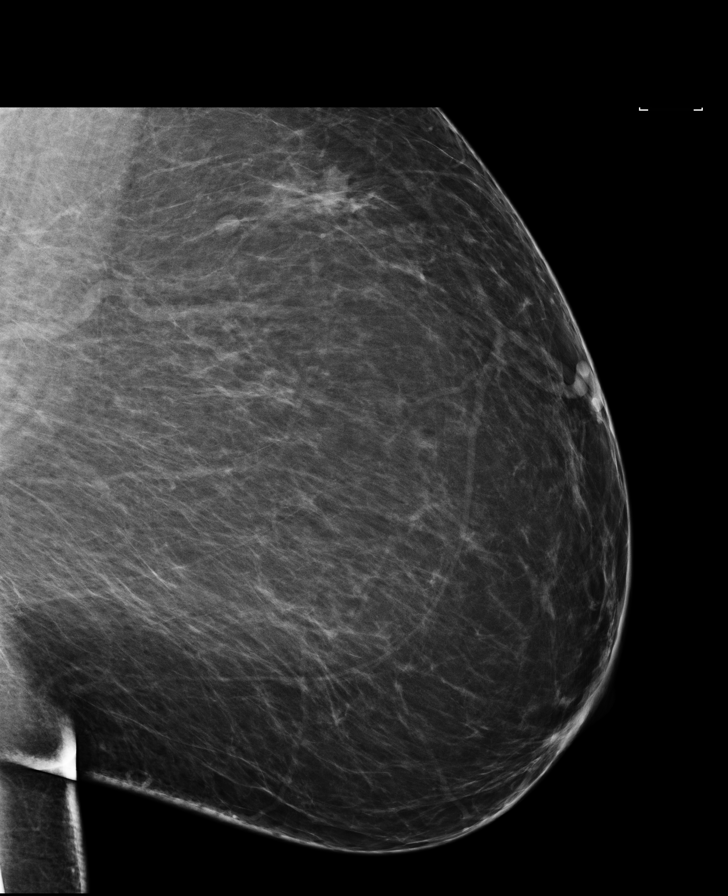

[R MLO (1 of 2)]
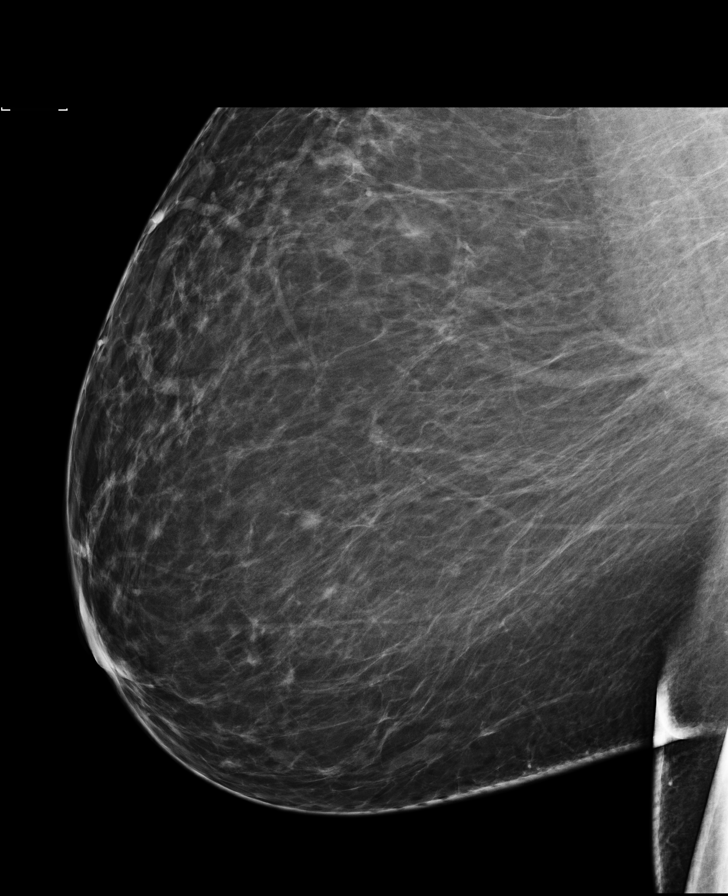

[L MLO (2 of 2)]
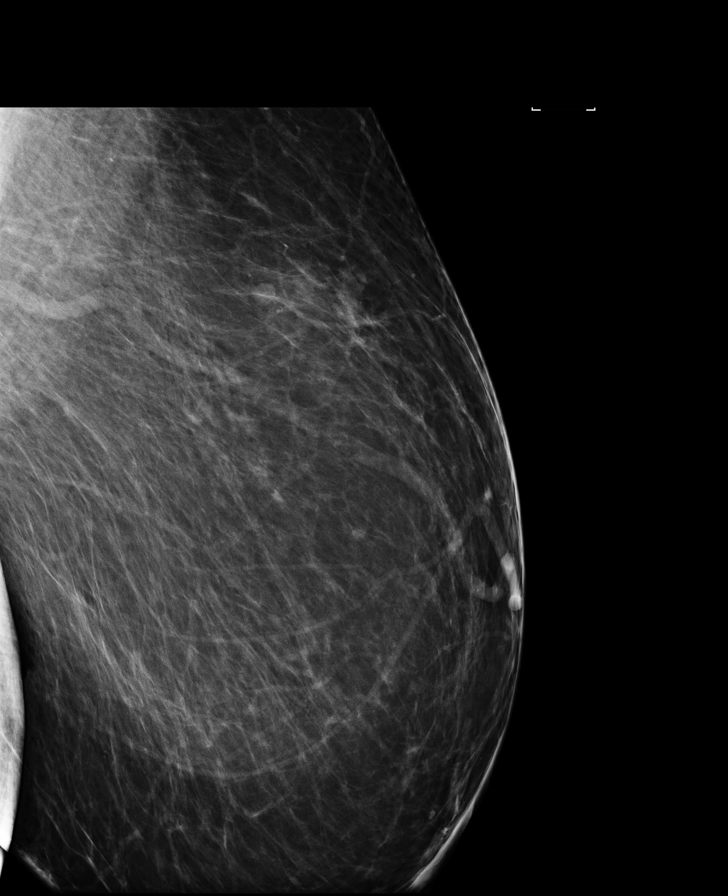

[L CC]
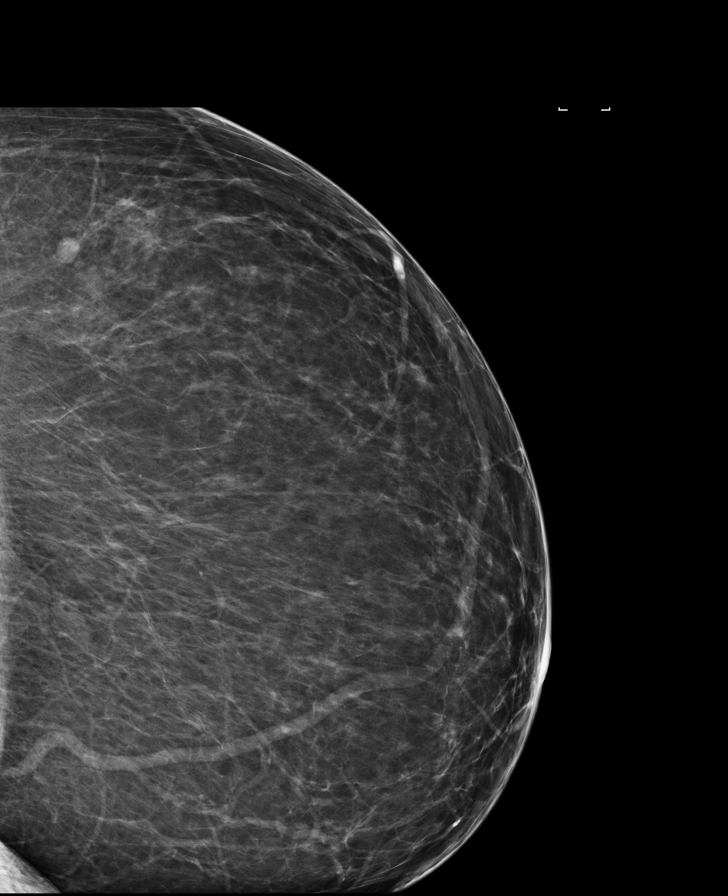

[R MLO (2 of 2)]
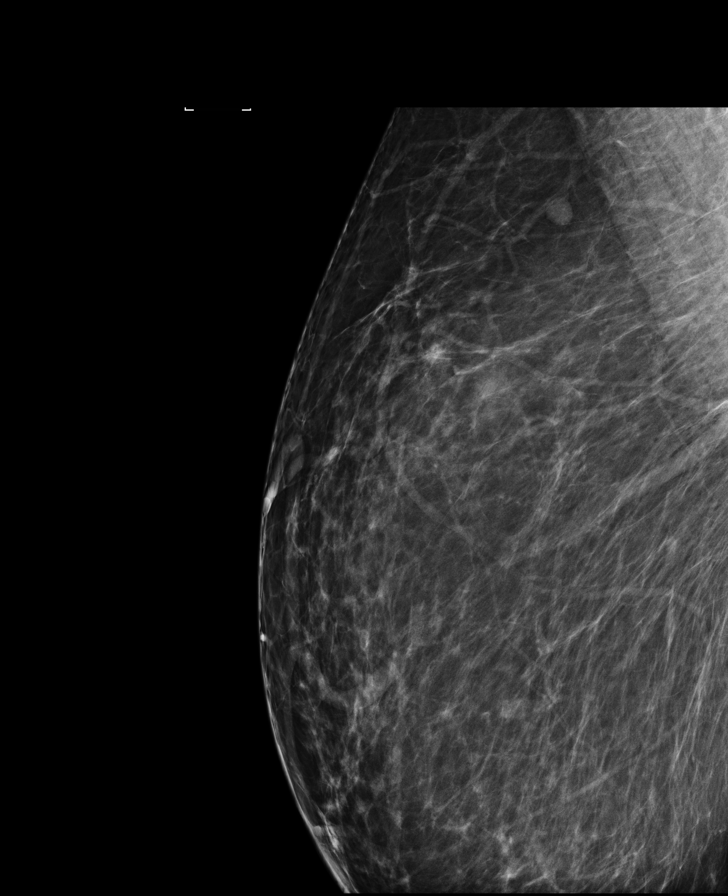

[R CV]
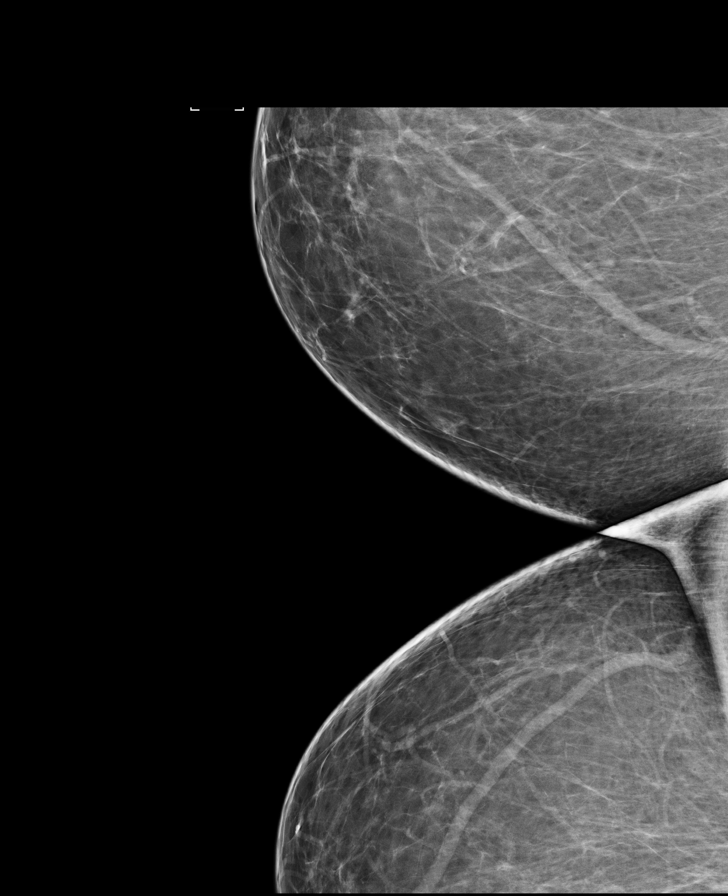

[7 of 7 positions shown; findings below may reference images not displayed]

ACR Breast Density Category b: There are scattered areas of
fibroglandular density.
FINDINGS: There are no findings suspicious for malignancy. Images were
processed with CAD.
IMPRESSION: No mammographic evidence of malignancy. A result letter of this
screening mammogram will be mailed directly to the patient.

RECOMMENDATION:
Screening mammogram in one year. (Code:AS-G-LCT)

BI-RADS CATEGORY  1: Negative.

## 2022-07-20 ENCOUNTER — Encounter: Payer: Self-pay | Admitting: Obstetrics & Gynecology

## 2022-07-20 ENCOUNTER — Other Ambulatory Visit (HOSPITAL_COMMUNITY)
Admission: RE | Admit: 2022-07-20 | Discharge: 2022-07-20 | Disposition: A | Discharge status: Home / Self Care | Payer: Medicaid Other | Source: Ambulatory Visit | Attending: Obstetrics & Gynecology | Admitting: Obstetrics & Gynecology

## 2022-07-20 ENCOUNTER — Ambulatory Visit (INDEPENDENT_AMBULATORY_CARE_PROVIDER_SITE_OTHER): Payer: Medicaid Other | Admitting: Obstetrics & Gynecology

## 2022-07-20 VITALS — BP 131/84 | HR 64 | Ht 69.0 in | Wt 308.0 lb

## 2022-07-20 DIAGNOSIS — Z01419 Encounter for gynecological examination (general) (routine) without abnormal findings: Secondary | ICD-10-CM | POA: Diagnosis present

## 2022-07-20 DIAGNOSIS — Z1339 Encounter for screening examination for other mental health and behavioral disorders: Secondary | ICD-10-CM | POA: Diagnosis not present

## 2022-07-20 DIAGNOSIS — I10 Essential (primary) hypertension: Secondary | ICD-10-CM

## 2022-07-20 DIAGNOSIS — Z0000 Encounter for general adult medical examination without abnormal findings: Secondary | ICD-10-CM

## 2022-07-20 DIAGNOSIS — Z96641 Presence of right artificial hip joint: Secondary | ICD-10-CM | POA: Diagnosis not present

## 2022-07-20 NOTE — Progress Notes (Signed)
Patient ID: Megan [name], female   DOB: February 04, 1971, 52 y.o.   MRN: [id number]  Chief Complaint  Patient presents with   Annual Exam    HPI Megan [name] is a 52 y.o. female.  [id number] .Patient's last menstrual period was 05/14/2022. Perimenopausal woman for gyn exam today. Pap is due next year. She has vaginal odor and irritation and requests diagnosis. 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 She never had sx or Dx of genital or labial herpes. 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     Past Surgical History:  Procedure Laterality Date   BREAST REDUCTION SURGERY Bilateral 01/05/2020   Procedure: BILATERAL MAMMARY REDUCTION  (BREAST);  Surgeon: Cindra Presume, MD;  Location: [place];  Service: Plastics;  Laterality: Bilateral;  2.5 hours   COLONOSCOPY     TOOTH EXTRACTION     TOTAL HIP ARTHROPLASTY Right 07/19/2017   Procedure: RIGHT TOTAL HIP ARTHROPLASTY ANTERIOR APPROACH;  Surgeon: Mcarthur Rossetti, MD;  Location: [facility];  Service: Orthopedics;  Laterality: Right;    Family History  Problem Relation Age of Onset   Cancer Mother    Cancer Father     Social History Social History   Tobacco Use   Smoking status: Never   Smokeless tobacco: Never  Vaping Use   Vaping Use: Never used  Substance Use Topics   Alcohol use: No   Drug use: No    Allergies  Allergen Reactions   Shellfish Allergy Anaphylaxis   Latex Rash    Current Outpatient Medications  Medication Sig Dispense Refill   acetaminophen (TYLENOL) 500 MG tablet Take 1 tablet (500 mg  total) by mouth every 6 (six) hours as needed. For use AFTER surgery 30 tablet 0   amLODipine (NORVASC) 10 MG tablet Take 1 tablet (10 mg total) by mouth daily. 30 tablet 3   FLUoxetine (PROZAC) 40 MG capsule Take 40 mg by mouth daily.     gabapentin (NEURONTIN) 300 MG capsule Take 300 mg by mouth 3 (three) times daily.     HYDROcodone-acetaminophen (NORCO) 10-325 MG tablet Take 1 tablet by mouth 2 (two) times daily as needed.     meloxicam (MOBIC) 15 MG tablet Take 15 mg by mouth daily.     ondansetron (ZOFRAN) 4 MG tablet Take 1 tablet (4 mg total) by mouth every 8 (eight) hours as needed for nausea or vomiting. 20 tablet 0   Oxcarbazepine (TRILEPTAL) 300 MG tablet Take 300 mg by mouth 2 (two) times daily.     tiZANidine (ZANAFLEX) 4 MG capsule Take 4 mg by mouth 3 (three) times daily.     Vitamin D, Ergocalciferol, (DRISDOL) 50000 units CAPS capsule Take 50,000 Units by mouth every Thursday. IN THE MORNING.     No current facility-administered medications for this visit.    Review of Systems Review of Systems  Constitutional: Negative.   Respiratory: Negative.    Cardiovascular: Negative.   Gastrointestinal:  Positive for constipation (occasional).  Genitourinary:  Positive  for vaginal discharge.    Blood pressure 131/84, pulse 64, height 5\' 9"  (1.753 m), weight (!) 139.7 kg, last menstrual period 05/14/2022.  Physical Exam Physical Exam Vitals and nursing note reviewed. Exam conducted with a chaperone present.  Constitutional:      Appearance: She is obese.  HENT:     Head: Normocephalic and atraumatic.  Cardiovascular:     Rate and Rhythm: Normal rate.  Pulmonary:     Effort: Pulmonary effort is normal.  Chest:  Breasts:    Breast asymmetry: surgical scars bilateral.     Right: Normal.     Left: Normal.  Abdominal:     General: Abdomen is flat.     Palpations: Abdomen is soft.  Genitourinary:    General: Normal vulva.     Exam position: Lithotomy position.      Vagina: Vaginal discharge present.     Cervix: Normal.     Uterus: Normal.      Adnexa: Right adnexa normal and left adnexa normal.  Musculoskeletal:     Cervical back: Normal range of motion.  Neurological:     Mental Status: She is alert.     Data Reviewed Pap 2022 nl  Assessment Well woman exam with routine gynecological exam - Plan: MM Digital Screening, Cervicovaginal ancillary only( [place]), Ambulatory referral to [place] hypertension  Status post total replacement of right hip   Plan Requests records of testing [facility] F/u on vaginal testing today Orders Placed This Encounter  Procedures   MM Digital Screening    Standing Status:   Future    Standing Expiration Date:   07/19/2023    Order Specific Question:   Reason for Exam (SYMPTOM  OR DIAGNOSIS REQUIRED)    Answer:   screening, s/p reduction mammoplasty    Order Specific Question:   Is the patient pregnant?    Answer:   No    Order Specific Question:   Preferred imaging location?    Answer:   [facility]   Ambulatory referral to [place]    Referral Priority:   Routine    Referral Type:   Consultation    Referral Reason:   Specialty Services Required    Number of Visits Requested:   1       Emeterio Reeve 07/20/2022, 9:08 AM

## 2022-07-20 NOTE — Progress Notes (Signed)
Pt states she is having some vaginal odor and itching.  Pt states she is now having irregular cycles - LMP January 2024.   Pt states she sees [facility] and has discussed Colonoscopy with them.  Depression score in office 10. Referral entered for counseling.

## 2022-07-23 LAB — CERVICOVAGINAL ANCILLARY ONLY
Bacterial Vaginitis (gardnerella): NEGATIVE
Candida Glabrata: NEGATIVE
Candida Vaginitis: NEGATIVE
Chlamydia: NEGATIVE
Comment: NEGATIVE
Comment: NEGATIVE
Comment: NEGATIVE
Comment: NEGATIVE
Comment: NEGATIVE
Comment: NORMAL
Neisseria Gonorrhea: NEGATIVE
Trichomonas: NEGATIVE

## 2022-07-31 ENCOUNTER — Ambulatory Visit (INDEPENDENT_AMBULATORY_CARE_PROVIDER_SITE_OTHER): Payer: Medicaid Other | Admitting: Licensed Clinical Social Worker

## 2022-07-31 DIAGNOSIS — F320 Major depressive disorder, single episode, mild: Secondary | ICD-10-CM

## 2022-08-06 NOTE — BH Specialist Note (Signed)
Integrated Behavioral Health via Telemedicine Visit  08/06/2022 Hollee Lamberton [id number]  Number of [place] Clinician visits: 1 Session Start time:  9:30am Session End time: 9:55am Total time in minutes: 25 mis via mychart video  Referring Provider: Dr. Roselie Awkward  Patient/Family location: Home [facility] Provider location: [place] All persons participating in visit: Pt D Joya Gaskins and LCSW A Giavana Rooke  Types of Service: Individual psychotherapy and Video visit  I connected with Deland Pretty and/or Dea Freels's n/a via  Telephone or Geologist, engineering  (Video is Caregility application) and verified that I am speaking with the correct person using two identifiers. Discussed confidentiality: Yes   I discussed the limitations of telemedicine and the availability of in person appointments.  Discussed there is a possibility of technology failure and discussed alternative modes of communication if that failure occurs.  I discussed that engaging in this telemedicine visit, they consent to the provision of behavioral healthcare and the services will be billed under their insurance.  Patient and/or legal guardian expressed understanding and consented to Telemedicine visit: Yes   Presenting Concerns: Patient and/or family reports the following symptoms/concerns: history of depression  Duration of problem: over one year; Severity of problem: mild  Patient and/or Family's Strengths/Protective Factors: Concrete supports in place (healthy food, safe environments, etc.)  Goals Addressed: Patient will:  Reduce symptoms of: depression   Increase knowledge and/or ability of: coping skills   Demonstrate ability to: Increase healthy adjustment to current life circumstances  Progress towards Goals: Ongoing  Interventions: Interventions utilized:  Motivational Interviewing and Supportive Counseling Standardized Assessments completed: PHQ 9  Patient and/or Family  Response: Ms Greiff reports depressed mood most days and trouble concentrating. Ms Lamoreaux and [place] discuss coping skills such as physical activity, self care and therapy to alleviate depressive symptoms    Assessment: Patient currently experiencing depression.   Patient may benefit from integrated behavioral health.  Plan: Follow up with behavioral health clinician on : 2-3 weeks via mychart  Behavioral recommendations: Counteract negative thought patterns, self care activities, set smart goals and identify emotional and behavioral triggers.  Referral(s): [place] (In Clinic)  I discussed the assessment and treatment plan with the patient and/or parent/guardian. They were provided an opportunity to ask questions and all were answered. They agreed with the plan and demonstrated an understanding of the instructions.   They were advised to call back or seek an in-person evaluation if the symptoms worsen or if the condition fails to improve as anticipated.  Lynnea Ferrier, LCSW

## 2022-08-14 ENCOUNTER — Encounter: Payer: Medicaid Other | Admitting: Licensed Clinical Social Worker

## 2022-09-04 ENCOUNTER — Ambulatory Visit: Payer: Medicaid Other
